# Patient Record
Sex: Male | Born: 1950 | Race: White | Hispanic: No | Marital: Married | Smoking: Never smoker
Health system: Southern US, Community
[De-identification: ages and names within clinical notes are randomized; demographics above are authoritative.]

## PROBLEM LIST (undated history)

## (undated) DIAGNOSIS — G2581 Restless legs syndrome: Secondary | ICD-10-CM

## (undated) DIAGNOSIS — I10 Essential (primary) hypertension: Secondary | ICD-10-CM

## (undated) DIAGNOSIS — R42 Dizziness and giddiness: Secondary | ICD-10-CM

## (undated) DIAGNOSIS — I4891 Unspecified atrial fibrillation: Secondary | ICD-10-CM

## (undated) DIAGNOSIS — E785 Hyperlipidemia, unspecified: Secondary | ICD-10-CM

## (undated) DIAGNOSIS — F32A Depression, unspecified: Secondary | ICD-10-CM

## (undated) DIAGNOSIS — I499 Cardiac arrhythmia, unspecified: Secondary | ICD-10-CM

## (undated) HISTORY — PX: HERNIA REPAIR: SHX51

---

## 2010-08-25 ENCOUNTER — Inpatient Hospital Stay: Payer: Self-pay | Admitting: Internal Medicine

## 2012-04-20 IMAGING — CR DG CHEST 1V PORT
1 series · 1 of 1 positions shown · non-contrast
Comparison: none

REASON FOR EXAM: Chest Pain
COMMENTS:

PROCEDURE:     DXR - DXR PORTABLE CHEST SINGLE VIEW  - August 25, 2010  [DATE]
RESULT:     Comparison: None

[view not recorded]
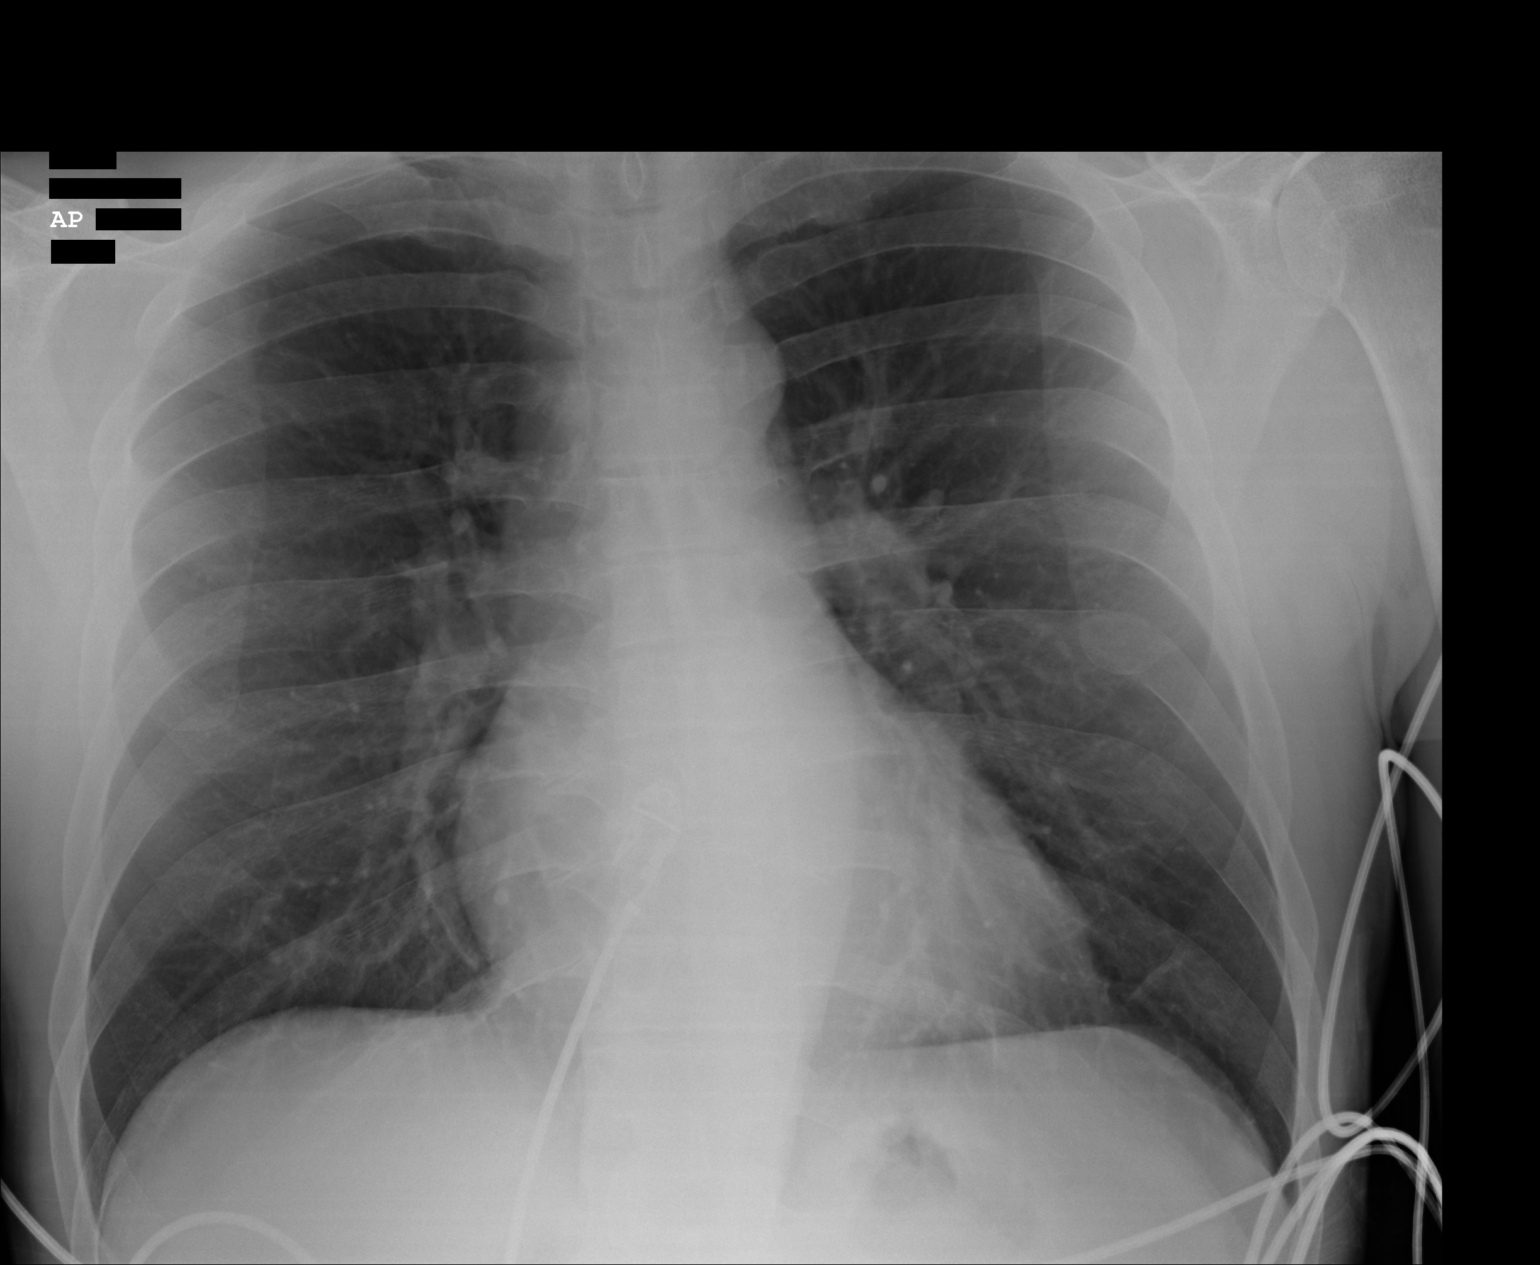

[1 of 1 positions shown; findings below may reference images not displayed]

FINDINGS: Single portable AP chest radiograph is provided.  There is no focal
parenchymal opacity, pleural effusion, or pneumothorax. Normal
cardiomediastinal silhouette. The osseous structures are unremarkable.
IMPRESSION: No acute disease of the chest.

## 2013-08-04 ENCOUNTER — Ambulatory Visit: Payer: Self-pay | Admitting: Surgery

## 2013-08-04 LAB — CBC WITH DIFFERENTIAL/PLATELET
Basophil #: 0.1 10*3/uL (ref 0.0–0.1)
Basophil %: 1.2 %
Eosinophil %: 9.1 %
HCT: 43.5 % (ref 40.0–52.0)
HGB: 14.9 g/dL (ref 13.0–18.0)
Lymphocyte #: 1.9 10*3/uL (ref 1.0–3.6)
MCH: 31.6 pg (ref 26.0–34.0)
MCHC: 34.2 g/dL (ref 32.0–36.0)
Monocyte #: 0.7 x10 3/mm (ref 0.2–1.0)
Neutrophil #: 2.8 10*3/uL (ref 1.4–6.5)
Neutrophil %: 46.3 %
Platelet: 182 10*3/uL (ref 150–440)
WBC: 6 10*3/uL (ref 3.8–10.6)

## 2013-08-04 LAB — BASIC METABOLIC PANEL
Calcium, Total: 8.9 mg/dL (ref 8.5–10.1)
Chloride: 106 mmol/L (ref 98–107)
Creatinine: 1.02 mg/dL (ref 0.60–1.30)
EGFR (African American): >60
EGFR (Non-African Amer.): >60
Glucose: 94 mg/dL (ref 65–99)
Osmolality: 284 (ref 275–301)
Potassium: 4 mmol/L (ref 3.5–5.1)

## 2013-08-06 ENCOUNTER — Ambulatory Visit: Payer: Self-pay | Admitting: Surgery

## 2015-01-07 NOTE — Op Note (Signed)
PATIENT NAME:  Jason Winters, Jason Winters MR#:  [id number] DATE OF BIRTH:  1951/03/17  DATE OF PROCEDURE:  08/06/2013  PREOPERATIVE DIAGNOSIS: Bilateral inguinal hernias.   POSTOPERATIVE DIAGNOSIS: Bilateral inguinal hernias.   PROCEDURE: Laparoscopic bilateral inguinal hernia repairs.   SURGEON: Renda Rolls, M.D.   ANESTHESIA: General.   INDICATIONS: This 64 year old had a right inguinal hernia repair when he was a toddler, but recently has had bilateral bulging in the groins. Bilateral inguinal hernias were demonstrated on physical exam and repair is recommended for definitive treatment.   DESCRIPTION OF PROCEDURE: The patient was placed on the operating table in the supine position under general endotracheal anesthesia. The abdomen was prepared with ChloraPrep and draped in a sterile manner. A short incision was made in the inferior aspect of the umbilicus and carried down to the deep fascia, which was grasped with laryngeal hook and elevated. A Veress needle was inserted, aspirated, and irrigated with a saline solution. Next, the peritoneal cavity was inflated with carbon dioxide. The Veress needle was removed. The 10 mm cannula was inserted. The 10 mm 25 degree laparoscope was inserted to view the peritoneal cavity. There was a direct inguinal hernia seen on the right side and there was a direct and an indirect inguinal hernia seen on the left side. Another incision was made in the right mid abdomen to introduce a 5 mm cannula. Another incision was made in the left mid abdomen to introduce a 5 mm cannula.   On the right side, the incision was made in the peritoneum some 5 inches in length and a peritoneal flap was raised posteriorly. The direct inguinal hernia sac was dissected away from the surrounding fascia away from the attenuated transversalis fascia. Cooper's ligament was identified. Circumferential dissection was carried out around the cord structures and also dissection was carried out around  the inferior epigastric vessels. There was no indirect component. Next, an Atrium mesh was cut to create an oval shape of some 3 x 5 inches with a posterior oblique slit and cruciate incision. This was inserted and passed around the inferior epigastric vessels and around the cord structures. The slit was overlapped and closed with a spiral tacker. The mesh was attached to the Cooper's ligament, the rectus muscle and transversalis fascia with a row of tacks carried laterally to the iliopubic tract. The peritoneum was closed over this with a spiral tacker. Hemostasis was intact.   Attention was then turned to the left side where a similar incision was made in the peritoneum and a peritoneal flap was raised posteriorly. An indirect hernia sac was dissected free from the cord structures and retracted back away from the abdominal wall and also the direct sac was dissected away from the abdominal sidewall, away from the transversalis fascia and identified Cooper's ligament. Next, a similar mesh was cut and inserted, passed around the inferior epigastric vessels and around the cord structures. The slit was overlapped and closed with a spiral tacker. The mesh was likewise attached to the Cooper's ligament, rectus muscle and transversalis fascia. The repair looked good. Peritoneum was closed with a spiral tacker. Hemostasis was intact. The right side was briefly inspected. Hemostasis remained intact. Next, the cannulas were removed allowing carbon dioxide to escape from the peritoneal cavity. The fascial defect at the umbilicus was closed with a 0 Vicryl simple suture. The skin incisions were closed with interrupted 5-0 chromic subcuticular suture, benzoin, and Steri-Strips. Dressings were applied with paper tape. The patient tolerated surgery satisfactorily and was  prepared for transfer to the recovery room.    ____________________________ Shela CommonsJ. Renda RollsWilton Olen Eaves, MD jws:aw D: 08/06/2013 11:51:33 ET T: 08/06/2013 12:00:55  ET JOB#: [id number]  cc: Adella HareJ. Wilton Nusaiba Guallpa, MD, <Dictator> Adella HareWILTON J Derran Sear MD ELECTRONICALLY SIGNED 08/07/2013 19:29

## 2018-12-10 ENCOUNTER — Encounter: Admission: RE | Payer: Self-pay | Source: Home / Self Care

## 2018-12-10 ENCOUNTER — Ambulatory Visit: Admission: RE | Admit: 2018-12-10 | Payer: Medicare HMO | Source: Home / Self Care | Admitting: Internal Medicine

## 2018-12-10 SURGERY — COLONOSCOPY WITH PROPOFOL
Anesthesia: General

## 2020-05-04 LAB — COLOGUARD: COLOGUARD: POSITIVE — AB

## 2020-10-13 ENCOUNTER — Other Ambulatory Visit
Admission: RE | Admit: 2020-10-13 | Discharge: 2020-10-13 | Disposition: A | Discharge status: Home / Self Care | Payer: Medicare HMO | Source: Ambulatory Visit | Attending: Internal Medicine | Admitting: Internal Medicine

## 2020-10-13 ENCOUNTER — Other Ambulatory Visit: Payer: Self-pay

## 2020-10-13 DIAGNOSIS — Z01812 Encounter for preprocedural laboratory examination: Secondary | ICD-10-CM | POA: Diagnosis present

## 2020-10-13 DIAGNOSIS — Z20822 Contact with and (suspected) exposure to covid-19: Secondary | ICD-10-CM | POA: Insufficient documentation

## 2020-10-14 ENCOUNTER — Encounter: Payer: Self-pay | Admitting: Internal Medicine

## 2020-10-14 LAB — SARS CORONAVIRUS 2 (TAT 6-24 HRS): SARS Coronavirus 2: NEGATIVE

## 2020-10-17 ENCOUNTER — Other Ambulatory Visit: Payer: Self-pay

## 2020-10-17 ENCOUNTER — Ambulatory Visit: Payer: Medicare HMO | Admitting: Anesthesiology

## 2020-10-17 ENCOUNTER — Ambulatory Visit
Admission: RE | Admit: 2020-10-17 | Discharge: 2020-10-17 | Disposition: A | Discharge status: Home / Self Care | Payer: Medicare HMO | Attending: Internal Medicine | Admitting: Internal Medicine

## 2020-10-17 ENCOUNTER — Encounter
Admission: RE | Disposition: A | Discharge status: Home / Self Care | Payer: Self-pay | Source: Home / Self Care | Attending: Internal Medicine

## 2020-10-17 ENCOUNTER — Encounter: Payer: Self-pay | Admitting: Internal Medicine

## 2020-10-17 DIAGNOSIS — K621 Rectal polyp: Secondary | ICD-10-CM | POA: Insufficient documentation

## 2020-10-17 DIAGNOSIS — R195 Other fecal abnormalities: Secondary | ICD-10-CM | POA: Diagnosis not present

## 2020-10-17 DIAGNOSIS — Z7901 Long term (current) use of anticoagulants: Secondary | ICD-10-CM | POA: Insufficient documentation

## 2020-10-17 DIAGNOSIS — Z79899 Other long term (current) drug therapy: Secondary | ICD-10-CM | POA: Insufficient documentation

## 2020-10-17 DIAGNOSIS — K640 First degree hemorrhoids: Secondary | ICD-10-CM | POA: Insufficient documentation

## 2020-10-17 HISTORY — DX: Unspecified atrial fibrillation: I48.91

## 2020-10-17 HISTORY — DX: Depression, unspecified: F32.A

## 2020-10-17 HISTORY — DX: Cardiac arrhythmia, unspecified: I49.9

## 2020-10-17 HISTORY — PX: COLONOSCOPY: SHX5424

## 2020-10-17 HISTORY — DX: Hyperlipidemia, unspecified: E78.5

## 2020-10-17 HISTORY — DX: Dizziness and giddiness: R42

## 2020-10-17 HISTORY — DX: Restless legs syndrome: G25.81

## 2020-10-17 HISTORY — DX: Essential (primary) hypertension: I10

## 2020-10-17 SURGERY — COLONOSCOPY
Anesthesia: General

## 2020-10-17 MED ORDER — LIDOCAINE HCL (CARDIAC) PF 100 MG/5ML IV SOSY
PREFILLED_SYRINGE | INTRAVENOUS | Status: DC | PRN
  Administered 2020-10-17: 50 mg via INTRAVENOUS

## 2020-10-17 MED ORDER — PROPOFOL 500 MG/50ML IV EMUL
INTRAVENOUS | Status: AC
  Filled 2020-10-17: qty 50

## 2020-10-17 MED ORDER — SODIUM CHLORIDE 0.9 % IV SOLN: INTRAVENOUS | Status: DC

## 2020-10-17 MED ORDER — PROPOFOL 10 MG/ML IV BOLUS
INTRAVENOUS | Status: DC | PRN
  Administered 2020-10-17: 70 mg via INTRAVENOUS

## 2020-10-17 MED ORDER — PROPOFOL 500 MG/50ML IV EMUL
INTRAVENOUS | Status: DC | PRN
  Administered 2020-10-17: 175 ug/kg/min via INTRAVENOUS

## 2020-10-17 NOTE — Anesthesia Postprocedure Evaluation (Signed)
Anesthesia Post Note  Patient: Jason Winters  Procedure(s) Performed: COLONOSCOPY (N/A )  Patient location during evaluation: Endoscopy Anesthesia Type: General Level of consciousness: awake and alert Pain management: pain level controlled Vital Signs Assessment: post-procedure vital signs reviewed and stable Respiratory status: spontaneous breathing, nonlabored ventilation, respiratory function stable and patient connected to nasal cannula oxygen Cardiovascular status: blood pressure returned to baseline and stable Postop Assessment: no apparent nausea or vomiting Anesthetic complications: no   No complications documented.   Last Vitals:  Vitals:   10/17/20 1518 10/17/20 1519  BP: 98/69 (!) 145/88  Pulse: (!) 46 (!) 56  Resp: 13 13  Temp:    SpO2: 97% 99%    Last Pain:  Vitals:   10/17/20 1519  TempSrc:   PainSc: 0-No pain                 Corinda Gubler

## 2020-10-17 NOTE — Op Note (Signed)
[facility] Gastroenterology Patient Name: Jason Winters Procedure Date: 10/17/2020 2:37 PM MRN: [id number] Account #: [id number] Date of Birth: 1950/10/14 Admit Type: Outpatient Age: 70 Room: [facility] ENDO ROOM 2 Gender: Male Note Status: Finalized Procedure:             Colonoscopy Indications:           Positive Cologuard test Providers:             Boykin Nearing. Norma Fredrickson MD, MD Referring MD:          Hassell Halim MD (Referring MD) Medicines:             Propofol per Anesthesia Complications:         No immediate complications. Procedure:             Pre-Anesthesia Assessment:                        - The risks and benefits of the procedure and the                         sedation options and risks were discussed with the                         patient. All questions were answered and informed                         consent was obtained.                        - Patient identification and proposed procedure were                         verified prior to the procedure by the nurse. The                         procedure was verified in the procedure room.                        - ASA Grade Assessment: III - A patient with severe                         systemic disease.                        - After reviewing the risks and benefits, the patient                         was deemed in satisfactory condition to undergo the                         procedure.                        After obtaining informed consent, the colonoscope was                         passed under direct vision. Throughout the procedure,                         the patient's blood pressure, pulse, and  oxygen                         saturations were monitored continuously. The                         Colonoscope was introduced through the anus and                         advanced to the the cecum, identified by appendiceal                         orifice and ileocecal valve. The colonoscopy was                          performed without difficulty. The patient tolerated                         the procedure well. The quality of the bowel                         preparation was good. The ileocecal valve, appendiceal                         orifice, and rectum were photographed. Findings:      The perianal and digital rectal examinations were normal. Pertinent       negatives include normal sphincter tone and no palpable rectal lesions.      Non-bleeding internal hemorrhoids were found during retroflexion. The       hemorrhoids were Grade I (internal hemorrhoids that do not prolapse).      A 10 mm polyp was found in the rectum. The polyp was semi-pedunculated.       The polyp was removed with a hot snare. Resection and retrieval were       complete. To prevent bleeding after the polypectomy, one hemostatic clip       was successfully placed (MR conditional). There was no bleeding during,       or at the end, of the procedure.      The exam was otherwise without abnormality. Impression:            - Non-bleeding internal hemorrhoids.                        - One 10 mm polyp in the rectum, removed with a hot                         snare. Resected and retrieved. Clip (MR conditional)                         was placed.                        - The examination was otherwise normal. Recommendation:        - Patient has a contact number available for                         emergencies. The signs and symptoms of potential  delayed complications were discussed with the patient.                         Return to normal activities tomorrow. Written                         discharge instructions were provided to the patient.                        - Resume previous diet.                        - Continue present medications.                        - Repeat colonoscopy is recommended for surveillance.                         The colonoscopy date will be determined  after                         pathology results from today's exam become available                         for review.                        - Return to GI office PRN.                        - The findings and recommendations were discussed with                         the patient. Procedure Code(s):     --- Professional ---                        [phone], Colonoscopy, flexible; with removal of                         tumor(s), polyp(s), or other lesion(s) by snare                         technique Diagnosis Code(s):     --- Professional ---                        R19.5, Other fecal abnormalities                        K64.0, First degree hemorrhoids                        K62.1, Rectal polyp CPT copyright 2019 [employer]. All rights reserved. The codes documented in this report are preliminary and upon coder review may  be revised to meet current compliance requirements. Stanton Kidney MD, MD 10/17/2020 3:05:15 PM This report has been signed electronically. Number of Addenda: 0 Note Initiated On: 10/17/2020 2:37 PM Scope Withdrawal Time: 0 hours 11 minutes 15 seconds  Total Procedure Duration: 0 hours 16 minutes 41 seconds  Estimated Blood Loss:  Estimated blood loss: none.      [facility]

## 2020-10-17 NOTE — H&P (Signed)
Outpatient short stay form Pre-procedure 10/17/2020 2:08 PM Jason Winters K. Norma Fredrickson, M.D.  Primary Physician: Kandyce Rud, M.D.  Reason for visit:  Positive Cologuard test  History of present illness:  Patient is a pleasant 70 y/o male presenting on referral from primary care provider for a POSITIVE Cologuard result. Patient denies change in bowel habits, rectal bleeding, weight loss or abdominal pain.      Current Facility-Administered Medications:  .  0.9 %  sodium chloride infusion, , Intravenous, Continuous, [place], Boykin Nearing, MD, Last Rate: 20 mL/hr at 10/17/20 1352, New Bag at 10/17/20 1352  Medications Prior to Admission  Medication Sig Dispense Refill Last Dose  . acetaminophen (TYLENOL) 500 MG tablet Take 500 mg by mouth every 6 (six) hours as needed for moderate pain.     Marland Kitchen amiodarone (PACERONE) 200 MG tablet Take 200 mg by mouth daily.   10/14/2020  . buPROPion (WELLBUTRIN XL) 300 MG 24 hr tablet Take 300 mg by mouth daily.   10/14/2020  . chlorthalidone (HYGROTON) 25 MG tablet Take 25 mg by mouth daily.   10/14/2020  . losartan (COZAAR) 25 MG tablet Take 25 mg by mouth daily.   10/14/2020  . rivaroxaban (XARELTO) 20 MG TABS tablet Take 20 mg by mouth daily with supper.   10/12/2020     No Known Allergies   Past Medical History:  Diagnosis Date  . AF (atrial fibrillation) (HCC)   . Depression   . Dysrhythmia   . Hyperlipidemia   . Hypertension   . RLS (restless legs syndrome)   . Vertigo     Review of systems:  Otherwise negative.    Physical Exam  Gen: Alert, oriented. Appears stated age.  HEENT: [place]/AT. PERRLA. Lungs: CTA, no wheezes. CV: RR nl S1, S2. Abd: soft, benign, no masses. BS+ Ext: No edema. Pulses 2+    Planned procedures: Proceed with colonoscopy. The patient understands the nature of the planned procedure, indications, risks, alternatives and potential complications including but not limited to bleeding, infection, perforation, damage to internal  organs and possible oversedation/side effects from anesthesia. The patient agrees and gives consent to proceed.  Please refer to procedure notes for findings, recommendations and patient disposition/instructions.     Ruperto Kiernan K. Norma Fredrickson, M.D. Gastroenterology 10/17/2020  2:08 PM

## 2020-10-17 NOTE — Anesthesia Procedure Notes (Signed)
Date/Time: 10/17/2020 2:39 PM Performed by: Ginger Carne, CRNA Pre-anesthesia Checklist: Patient identified, Emergency Drugs available, Suction available, Patient being monitored and Timeout performed Patient Re-evaluated:Patient Re-evaluated prior to induction Oxygen Delivery Method: Nasal cannula Preoxygenation: Pre-oxygenation with 100% oxygen Induction Type: IV induction

## 2020-10-17 NOTE — Interval H&P Note (Signed)
History and Physical Interval Note:  10/17/2020 2:09 PM  Jason Winters  has presented today for surgery, with the diagnosis of positive colrectal screening.  The various methods of treatment have been discussed with the patient and family. After consideration of risks, benefits and other options for treatment, the patient has consented to  Procedure(s): COLONOSCOPY (N/A) as a surgical intervention.  The patient's history has been reviewed, patient examined, no change in status, stable for surgery.  I have reviewed the patient's chart and labs.  Questions were answered to the patient's satisfaction.     [place], [place]

## 2020-10-17 NOTE — Transfer of Care (Signed)
Immediate Anesthesia Transfer of Care Note  Patient: Jason Winters  Procedure(s) Performed: COLONOSCOPY (N/A )  Patient Location: PACU  Anesthesia Type:General  Level of Consciousness: sedated  Airway & Oxygen Therapy: Patient Spontanous Breathing  Post-op Assessment: Report given to RN and Post -op Vital signs reviewed and stable  Post vital signs: Reviewed and stable  Last Vitals:  Vitals Value Taken Time  BP    Temp    Pulse 52 10/17/20 1509  Resp 13 10/17/20 1509  SpO2 99 % 10/17/20 1509  Vitals shown include unvalidated device data.  Last Pain:  Vitals:   10/17/20 1330  TempSrc: Temporal  PainSc: 0-No pain         Complications: No complications documented.

## 2020-10-17 NOTE — Anesthesia Preprocedure Evaluation (Signed)
Anesthesia Evaluation  Patient identified by MRN, date of birth, ID band Patient awake    Reviewed: Allergy & Precautions, NPO status , Patient's Chart, lab work & pertinent test results  History of Anesthesia Complications Negative for: history of anesthetic complications  Airway Mallampati: II       Dental   Pulmonary neg sleep apnea, neg COPD, Not current smoker,           Cardiovascular hypertension, Pt. on medications (-) Past MI and (-) CHF + dysrhythmias Atrial Fibrillation (-) Valvular Problems/Murmurs     Neuro/Psych neg Seizures Depression    GI/Hepatic Neg liver ROS, neg GERD  ,  Endo/Other  neg diabetes  Renal/GU Renal InsufficiencyRenal disease     Musculoskeletal   Abdominal   Peds  Hematology   Anesthesia Other Findings   Reproductive/Obstetrics                             Anesthesia Physical Anesthesia Plan  ASA: III  Anesthesia Plan: General   Post-op Pain Management:    Induction: Intravenous  PONV Risk Score and Plan: 2 and Propofol infusion and TIVA  Airway Management Planned: Nasal Cannula  Additional Equipment:   Intra-op Plan:   Post-operative Plan:   Informed Consent: I have reviewed the patients History and Physical, chart, labs and discussed the procedure including the risks, benefits and alternatives for the proposed anesthesia with the patient or authorized representative who has indicated his/her understanding and acceptance.       Plan Discussed with:   Anesthesia Plan Comments:         Anesthesia Quick Evaluation

## 2020-10-18 ENCOUNTER — Encounter: Payer: Self-pay | Admitting: Internal Medicine

## 2020-10-19 LAB — SURGICAL PATHOLOGY

## 2020-11-17 DIAGNOSIS — I4891 Unspecified atrial fibrillation: Secondary | ICD-10-CM | POA: Diagnosis not present

## 2020-11-17 DIAGNOSIS — R0602 Shortness of breath: Secondary | ICD-10-CM | POA: Diagnosis not present

## 2020-11-17 DIAGNOSIS — I2510 Atherosclerotic heart disease of native coronary artery without angina pectoris: Secondary | ICD-10-CM | POA: Diagnosis not present

## 2020-11-17 DIAGNOSIS — I10 Essential (primary) hypertension: Secondary | ICD-10-CM | POA: Diagnosis not present

## 2020-11-17 DIAGNOSIS — I340 Nonrheumatic mitral (valve) insufficiency: Secondary | ICD-10-CM | POA: Diagnosis not present

## 2021-03-21 DIAGNOSIS — I340 Nonrheumatic mitral (valve) insufficiency: Secondary | ICD-10-CM | POA: Diagnosis not present

## 2021-03-21 DIAGNOSIS — I10 Essential (primary) hypertension: Secondary | ICD-10-CM | POA: Diagnosis not present

## 2021-03-21 DIAGNOSIS — I2510 Atherosclerotic heart disease of native coronary artery without angina pectoris: Secondary | ICD-10-CM | POA: Diagnosis not present

## 2021-03-21 DIAGNOSIS — I4891 Unspecified atrial fibrillation: Secondary | ICD-10-CM | POA: Diagnosis not present

## 2021-04-25 DIAGNOSIS — Z1331 Encounter for screening for depression: Secondary | ICD-10-CM | POA: Diagnosis not present

## 2021-04-25 DIAGNOSIS — Z125 Encounter for screening for malignant neoplasm of prostate: Secondary | ICD-10-CM | POA: Diagnosis not present

## 2021-04-25 DIAGNOSIS — Z0000 Encounter for general adult medical examination without abnormal findings: Secondary | ICD-10-CM | POA: Diagnosis not present

## 2021-04-25 DIAGNOSIS — E7800 Pure hypercholesterolemia, unspecified: Secondary | ICD-10-CM | POA: Diagnosis not present

## 2021-04-25 DIAGNOSIS — Z79899 Other long term (current) drug therapy: Secondary | ICD-10-CM | POA: Diagnosis not present

## 2021-05-23 DIAGNOSIS — I4891 Unspecified atrial fibrillation: Secondary | ICD-10-CM | POA: Diagnosis not present

## 2021-05-23 DIAGNOSIS — I2510 Atherosclerotic heart disease of native coronary artery without angina pectoris: Secondary | ICD-10-CM | POA: Diagnosis not present

## 2021-05-23 DIAGNOSIS — I10 Essential (primary) hypertension: Secondary | ICD-10-CM | POA: Diagnosis not present

## 2021-05-23 DIAGNOSIS — I340 Nonrheumatic mitral (valve) insufficiency: Secondary | ICD-10-CM | POA: Diagnosis not present

## 2021-05-31 DIAGNOSIS — M7051 Other bursitis of knee, right knee: Secondary | ICD-10-CM | POA: Diagnosis not present

## 2022-12-17 ENCOUNTER — Other Ambulatory Visit: Payer: Self-pay | Admitting: Cardiovascular Disease

## 2023-01-09 ENCOUNTER — Ambulatory Visit: Payer: Medicare HMO | Admitting: Dermatology

## 2023-01-17 ENCOUNTER — Other Ambulatory Visit: Payer: Self-pay | Admitting: Cardiovascular Disease

## 2023-01-25 ENCOUNTER — Other Ambulatory Visit: Payer: Self-pay | Admitting: Cardiovascular Disease

## 2023-03-04 ENCOUNTER — Ambulatory Visit: Payer: Medicare HMO | Admitting: Cardiovascular Disease

## 2023-03-11 ENCOUNTER — Encounter: Payer: Self-pay | Admitting: Cardiovascular Disease

## 2023-03-11 ENCOUNTER — Ambulatory Visit: Payer: Medicare HMO | Admitting: Cardiovascular Disease

## 2023-03-11 VITALS — BP 120/90 | HR 53 | Ht 71.0 in | Wt 176.2 lb

## 2023-03-11 DIAGNOSIS — E782 Mixed hyperlipidemia: Secondary | ICD-10-CM

## 2023-03-11 DIAGNOSIS — Z8679 Personal history of other diseases of the circulatory system: Secondary | ICD-10-CM

## 2023-03-11 DIAGNOSIS — G4733 Obstructive sleep apnea (adult) (pediatric): Secondary | ICD-10-CM | POA: Diagnosis not present

## 2023-03-11 DIAGNOSIS — I340 Nonrheumatic mitral (valve) insufficiency: Secondary | ICD-10-CM

## 2023-03-11 DIAGNOSIS — I10 Essential (primary) hypertension: Secondary | ICD-10-CM

## 2023-03-11 NOTE — Progress Notes (Signed)
Cardiology Office Note   Date:  03/11/2023   ID:  Jason Winters, Jason Winters [birth year], MRN [id number]  PCP:  Kandyce Rud, MD  Cardiologist:  Adrian Blackwater, MD      History of Present Illness: Jason Winters is a 72 y.o. male who presents for  Chief Complaint  Patient presents with   Follow-up    5 mo F/u    HPI    Past Medical History:  Diagnosis Date   AF (atrial fibrillation) (HCC)    Depression    Dysrhythmia    Hyperlipidemia    Hypertension    RLS (restless legs syndrome)    Vertigo      Past Surgical History:  Procedure Laterality Date   COLONOSCOPY N/A 10/17/2020   Procedure: COLONOSCOPY;  Surgeon: Toledo, Boykin Nearing, MD;  Location: [facility] ENDOSCOPY;  Service: Gastroenterology;  Laterality: N/A;   HERNIA REPAIR       ACTIVE PROBLEMS & CONDITIONS    Assessment of Shortness of Breath    Atrial Fibrillation - History of paroxysmal AF, taking amiodarone and xarelto. Now [place] VASC score of 2 when he turned 67,    Atypical Chest Pain    Bronchitis    Chest Pain Or Discomfort    Coronary Artery Disease    Difficulty Breathing (Dyspnea)    Family History of Hypertension Systemic    History of Atrial Fibrillation - paroxysmal    History of Coronary Artery Disease - father    History of Essential Hypertension - borderline    History of Mitral Regurgitation    Hyperlipidemia    Hypertension Systemic    Mitral Regurgitation    Reported Family History of Heart Disease - father    Restless legs syndrome     CHIEF COMPLAINT  The Chief Complaint is: 3 MO FU.     HISTORY OF PRESENT ILLNESS  Jason Winters is a 72 year old male.   Patient in office for routine cardiac exam. No cardiac complaints.  Allergy list reviewed   Problem list reviewed   Medication list reviewed    No chest pain or discomfort   No palpitations  , and The heart rate was not fast    Not feeling congested in the chest   No dyspnea   No cough   No wheezing     CURRENT  MEDICATION    Amiodarone HCl 100 MG Oral Tablet TAKE 1 TABLET BY MOUTH EVERY DAY, 90 days, 1 refills    buPROPion HCl ER (SR) 150 MG Oral Tablet Extended Release 12 Hour  once daily, 0 days, 0 refills    hydroCHLOROthiazide 12.5 MG Oral Tablet TAKE 1 TABLET BY MOUTH EVERY DAY, 90 days, 1 refills    Losartan Potassium 25 MG Oral Tablet TAKE 1 TABLET BY MOUTH EVERY DAY, 90 days, 1 refills    Rosuvastatin Calcium 20 MG Oral Tablet TAKE 1 TABLET BY MOUTH EVERY DAY, 90 days, 1 refills    Xarelto 20 MG Oral Tablet TAKE 1 TABLET BY MOUTH EVERY DAY, 90 days, 1 refills     PAST MEDICAL/SURGICAL HISTORY  Diagnoses:  Atrial fibrillation paroxysmal Coronary artery disease father Mitral regurgitation MODERATE Essential hypertension borderline.   Nonorganic sleep apnea.   Hyperlipidemia Surgeries:  Deviated septum repair  Bilateral  inguinal hernia repair  Restless leg syndrome , but no sleep apnoea.     SOCIAL HISTORY  Tobacco use:  Not a current smoker and smoking status: Never smoker. Alcohol: Alcohol use.  Habits: Poor exercise habits. 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     ALLERGIES    No Known Allergies     FAMILY HISTORY  Heart disease father Lung cancer- Paternal grandmother  Colon cancer-Paernal grandfather  Systemic hypertension     REVIEW OF SYSTEMS  Systemic: Not feeling poorly (malaise).  No recent weight change. Head: No headache and no sinus pain. Cardiovascular: No chest pain or discomfort, no palpitations, and the heart rate was not fast. Pulmonary: No dyspnea, no cough, and no wheezing. Gastrointestinal: No heartburn.  No nausea, no vomiting, no abdominal pain, and no diarrhea. Neurological: No dizziness, no vertigo, and no fainting. Psychological: No anxiety and no depression.     PHYSICAL FINDINGS    Vitals taken 10/16/2022 10:31 am  BP-Sitting  120/80 mmHg 100 - 120/60 - 80  Pulse Rate-Sitting  51 bpm 50 - 100  Temp-Oral  97.4 F 96 -  101  Height  71 in 64 - 74  Weight  170 lbs 9.6 oz 123 - 215  Body Mass Index  23.8 kg/m2   Body Surface Area  2 m2   Oxygen Saturation  97 % 93 - 100    General Appearance:  In no acute distress. Lungs:  Clear to auscultation. Cardiovascular: Jugular Venous Distention:  JVD not increased.   JVD not increased. Heart Rate And Rhythm:  Normal.   Normal. Heart Sounds:  Normal. Murmurs:  No murmurs were heard. Carotid Arteries:  No bruit in the carotid artery. Arterial Pulses:  Equal bilaterally and normal. Edema:  Not present. Abdomen: Auscultation:  Bowel sounds were normal. Palpation:  Abdominal non-tender. Neurological:  Oriented to time, place, and person.     ASSESSMENT   Atrial fibrillation  Coronary artery disease  Mitral regurgitation  Systemic hypertension  Nonorganic sleep apnea  Hyperlipidemia     THERAPY   Continue current medication.  Clinical summary provided to patient.     COUNSELING/EDUCATION   Education and counseling diet and exercise     PLAN     Unspecified atrial fibrillation Xarelto 20 MG tablet TAKE 1 TABLET BY MOUTH EVERY DAY, 90 days, 1 refills     Return to the clinic if condition worsens or new symptoms arise  Follow-up visit Patient doing well. No complaints. In sinus rhythm on auscultation. Continue same medications.        Started cpap machine.     Return in 4-5 months or sooner if needed.      Adrian Blackwater MD  Electronically signed by: Adrian Blackwater     Date: 10/16/2022 10:45 Current Outpatient Medications  Medication Sig Dispense Refill   acetaminophen (TYLENOL) 500 MG tablet Take 500 mg by mouth every 6 (six) hours as needed for moderate pain.     amiodarone (PACERONE) 100 MG tablet TAKE 1 TABLET BY MOUTH EVERY DAY 90 tablet 1   buPROPion (WELLBUTRIN XL) 300 MG 24 hr tablet Take 300 mg by mouth daily.     chlorthalidone (HYGROTON) 25 MG tablet Take 25 mg by mouth daily.     losartan (COZAAR) 25 MG tablet  TAKE 1 TABLET BY MOUTH EVERY DAY 90 tablet 1   rivaroxaban (XARELTO) 20 MG TABS tablet Take 20 mg by mouth daily with supper.     rosuvastatin (CRESTOR) 20 MG tablet TAKE 1 TABLET BY MOUTH EVERY DAY 90 tablet 1   No current facility-administered medications for this visit.    Allergies:   Patient has no known allergies.  Social History:   reports that he has never smoked. He has never used smokeless tobacco. He reports that he does not drink alcohol and does not use drugs.   Family History:  family history is not on file.    ROS:     Review of Systems  Constitutional: Negative.   HENT: Negative.    Eyes: Negative.   Respiratory: Negative.    Gastrointestinal: Negative.   Genitourinary: Negative.   Musculoskeletal: Negative.   Skin: Negative.   Neurological: Negative.   Endo/Heme/Allergies: Negative.   Psychiatric/Behavioral: Negative.    All other systems reviewed and are negative.     All other systems are reviewed and negative.    PHYSICAL EXAM: VS:  BP (!) 120/90   Pulse (!) 53   Ht 5\' 11"  (1.803 m)   Wt 176 lb 3.2 oz (79.9 kg)   SpO2 98%   BMI 24.57 kg/m  , BMI Body mass index is 24.57 kg/m. Last weight:  Wt Readings from Last 3 Encounters:  03/11/23 176 lb 3.2 oz (79.9 kg)  10/17/20 160 lb (72.6 kg)     Physical Exam Vitals reviewed.  Constitutional:      Appearance: Normal appearance. He is normal weight.  HENT:     Head: Normocephalic.     Nose: Nose normal.     Mouth/Throat:     Mouth: Mucous membranes are moist.  Eyes:     Pupils: Pupils are equal, round, and reactive to light.  Cardiovascular:     Rate and Rhythm: Normal rate and regular rhythm.     Pulses: Normal pulses.     Heart sounds: Normal heart sounds.  Pulmonary:     Effort: Pulmonary effort is normal.  Abdominal:     General: Abdomen is flat. Bowel sounds are normal.  Musculoskeletal:        General: Normal range of motion.     Cervical back: Normal range of motion.   Skin:    General: Skin is warm.  Neurological:     General: No focal deficit present.     Mental Status: He is alert.  Psychiatric:        Mood and Affect: Mood normal.       EKG:   Recent Labs: No results found for requested labs within last 365 days.    Lipid Panel No results found for: "CHOL", "TRIG", "HDL", "CHOLHDL", "VLDL", "[facility]", "LDLDIRECT"    TESTS                                                                                          [facility] [address] [place], [place] [address] [phone] STUDY:  Gated Stress / Rest Myocardial Perfusion Imaging Tomographic (SPECT) Including attenuation correction Wall Motion, Left Ventricular Ejection Fraction By Gated Technique.Treadmill Stress Test. SEX: Male   WEIGHT: 160 lbs  HEIGHT: 71 in    ARMS UP: YES/NO  REFERRING PHYSICIAN: Dr.Dequarius Jeffries Welton Flakes                                                                                                                                                                                                                       INDICATION FOR STUDY:  CP                                                                                                                                                                                                                   TECHNIQUE:  Approximately 20 minutes following the intravenous administration of 10.6 mCi of Tc-100m Sestamibi after stress testing in a reclined supine position with arms above their head if able to do so, gated SPECT imaging of the heart was performed. After about a 2hr break, the patient was injected intravenously with 32.1 mCi of Tc-5m Sestamibi.  Approximately 45 minutes later in the same position as stress imaging SPECT rest imaging of the heart was performed.  STRESS BY:  Adrian Blackwater, MD PROTOCOL: Smitty Cords  MAX PRED HR: 151                     85%: 128               75%: 113                                                                                                                   RESTING BP:  130/78  RESTING HR: 57  PEAK BP: 132/80   PEAK HR: 100 (66%)                                                                    EXERCISE DURATION: 8:00                                             METS: 10.1     REASON FOR TEST TERMINATION:  Fatigue                                                                                                                                 SYMPTOMS: Fatigue  [facility] TREADMILL SCORE: 8                                       RISK:  Low  EKG RESULTS:  Sinus bradycardia. 57/min. No significant ST changes at peak exercise.                                                            IMAGE QUALITY: Good                                                                                                                                                                                                                                                                                                                                  PERFUSION/WALL MOTION FINDINGS: EF = 74%. No perfusion defects, normal wall motion.                                                                           IMPRESSION:  Normal stress test with normal LVEF. Low heart rate.  Adrian Blackwater, MD Stress  Interpreting Physician / Nuclear Interpreting Physician                         Adrian Blackwater MD  Electronically signed by: Adrian Blackwater     Date: 03/13/2022 13:57 REASON FOR VISIT  Visit for: Echocardiogram/I 48.91  Sex:   Male         wt=162    lbs.  BP=118/66  Height=71    inches.        TESTS  Imaging: Echocardiogram:  An echocardiogram in (2-d) mode was performed and in Doppler mode with color flow velocity mapping was performed. The aortic valve cusps are abnormal 2.5 cm, flow velocity 1.19  m/s, and systolic calculated mean flow gradient 3 mmHg. Mitral valve diastolic peak flow velocity E .51   m/s and E/A ratio 1.0. Aortic root diameter 4.0   cm. The LVOT internal diameter 3.6  cm and flow velocity was abnormal .887   m/s. LV systolic dimension 2.09 cm, diastolic 2.98  cm, posterior wall thickness 1.29  cm, fractional shortening 30.5 %, and EF 59.6 %. IVS thickness 1.68   cm. LA dimension 3.7  cm. Mitral Valve has Mild Regurgitation. Pulmonic Valve has Trace Regurgitation. Tricuspid Valve hasTrace to Mild Regurgitation.     ASSESSMENT  Technically adequate study.  Normal chamber sizes.  Normal left ventricular systolic function.  Mild left ventricular hypertrophy with GRADE 1 (relaxation abnormality) diastolic dysfunction.  Normal right ventricular systolic function.  Normal right ventricular diastolic function.  Normal left ventricular wall motion.  Normal right ventricular wall motion.  Trace pulmonary regurgitation.  Trace to Mild tricuspid regurgitation.  Mild pulmonary hypertension.  Mild mitral regurgitation.  No pericardial effusion.  Mildly dilated Left atrium  Mild LVH.     THERAPY   Referring physician: Laurier Nancy  Sonographer: Adrian Blackwater.      Adrian Blackwater MD  Electronically signed by: Adrian Blackwater     Date: 02/14/2022 11:17 Other studies Reviewed: Additional studies/ records that were reviewed today include:  Review of the  above records demonstrates:       No data to display            ASSESSMENT AND PLAN:    ICD-10-CM   1. Nonrheumatic mitral valve regurgitation  I34.0     2. Atrial fibrillation, currently in sinus rhythm  Z86.79    Doing well on amiodrone and xarelto, no issues.    3. Mixed hyperlipidemia  E78.2     4. Obstructive sleep apnea syndrome  G47.33     5. Primary hypertension  I10        Problem List Items Addressed This Visit   None Visit Diagnoses     Nonrheumatic mitral valve regurgitation    -  Primary   Atrial fibrillation, currently in sinus rhythm       Doing well on amiodrone and xarelto, no issues.   Mixed hyperlipidemia       Obstructive sleep apnea syndrome       Primary hypertension              Disposition:   Return in about 3 months (around 06/11/2023).    Total time spent: 30 minutes  Signed,  Adrian Blackwater, MD  03/11/2023 9:49 AM    [facility]

## 2023-03-15 ENCOUNTER — Other Ambulatory Visit: Payer: Self-pay | Admitting: Cardiovascular Disease

## 2023-05-27 ENCOUNTER — Other Ambulatory Visit: Payer: Self-pay | Admitting: Cardiovascular Disease

## 2023-05-27 DIAGNOSIS — I4891 Unspecified atrial fibrillation: Secondary | ICD-10-CM

## 2023-06-11 ENCOUNTER — Ambulatory Visit: Payer: Medicare HMO | Admitting: Cardiovascular Disease

## 2023-06-11 ENCOUNTER — Encounter: Payer: Self-pay | Admitting: Cardiovascular Disease

## 2023-06-11 VITALS — BP 130/82 | HR 57 | Ht 71.0 in | Wt 177.2 lb

## 2023-06-11 DIAGNOSIS — I340 Nonrheumatic mitral (valve) insufficiency: Secondary | ICD-10-CM

## 2023-06-11 DIAGNOSIS — E782 Mixed hyperlipidemia: Secondary | ICD-10-CM

## 2023-06-11 DIAGNOSIS — G4733 Obstructive sleep apnea (adult) (pediatric): Secondary | ICD-10-CM

## 2023-06-11 DIAGNOSIS — Z8679 Personal history of other diseases of the circulatory system: Secondary | ICD-10-CM | POA: Diagnosis not present

## 2023-06-11 DIAGNOSIS — I10 Essential (primary) hypertension: Secondary | ICD-10-CM | POA: Diagnosis not present

## 2023-06-11 NOTE — Progress Notes (Signed)
Cardiology Office Note   Date:  06/11/2023   ID:  Sajjad, Billett [birth year], MRN [id number]  PCP:  Kandyce Rud, MD  Cardiologist:  Adrian Blackwater, MD      History of Present Illness: Jason Winters is a 72 y.o. male who presents for  Chief Complaint  Patient presents with   Follow-up    Doing well      Past Medical History:  Diagnosis Date   AF (atrial fibrillation) (HCC)    Depression    Dysrhythmia    Hyperlipidemia    Hypertension    RLS (restless legs syndrome)    Vertigo      Past Surgical History:  Procedure Laterality Date   COLONOSCOPY N/A 10/17/2020   Procedure: COLONOSCOPY;  Surgeon: Toledo, Boykin Nearing, MD;  Location: [facility] ENDOSCOPY;  Service: Gastroenterology;  Laterality: N/A;   HERNIA REPAIR       Current Outpatient Medications  Medication Sig Dispense Refill   acetaminophen (TYLENOL) 500 MG tablet Take 500 mg by mouth every 6 (six) hours as needed for moderate pain.     amiodarone (PACERONE) 100 MG tablet TAKE 1 TABLET BY MOUTH EVERY DAY 90 tablet 1   buPROPion (WELLBUTRIN XL) 300 MG 24 hr tablet Take 300 mg by mouth daily.     hydrochlorothiazide (HYDRODIURIL) 12.5 MG tablet TAKE 1 TABLET BY MOUTH EVERY DAY 90 tablet 1   losartan (COZAAR) 25 MG tablet TAKE 1 TABLET BY MOUTH EVERY DAY 90 tablet 1   rosuvastatin (CRESTOR) 20 MG tablet TAKE 1 TABLET BY MOUTH EVERY DAY 90 tablet 1   XARELTO 20 MG TABS tablet TAKE 1 TABLET BY MOUTH EVERY DAY 90 tablet 1   No current facility-administered medications for this visit.    Allergies:   Patient has no known allergies.    Social History:   reports that he has never smoked. He has never used smokeless tobacco. He reports that he does not drink alcohol and does not use drugs.   Family History:  family history is not on file.    ROS:     Review of Systems  Constitutional: Negative.   HENT: Negative.    Eyes: Negative.   Respiratory: Negative.    Gastrointestinal: Negative.    Genitourinary: Negative.   Musculoskeletal: Negative.   Skin: Negative.   Neurological: Negative.   Endo/Heme/Allergies: Negative.   Psychiatric/Behavioral: Negative.    All other systems reviewed and are negative.     All other systems are reviewed and negative.    PHYSICAL EXAM: VS:  BP 130/82 (BP Location: Left Arm)   Pulse (!) 57   Ht 5\' 11"  (1.803 m)   Wt 177 lb 3.2 oz (80.4 kg)   SpO2 96%   BMI 24.71 kg/m  , BMI Body mass index is 24.71 kg/m. Last weight:  Wt Readings from Last 3 Encounters:  06/11/23 177 lb 3.2 oz (80.4 kg)  03/11/23 176 lb 3.2 oz (79.9 kg)  10/17/20 160 lb (72.6 kg)     Physical Exam Vitals reviewed.  Constitutional:      Appearance: Normal appearance. He is normal weight.  HENT:     Head: Normocephalic.     Nose: Nose normal.     Mouth/Throat:     Mouth: Mucous membranes are moist.  Eyes:     Pupils: Pupils are equal, round, and reactive to light.  Cardiovascular:     Rate and Rhythm: Normal rate and regular rhythm.  Pulses: Normal pulses.     Heart sounds: Normal heart sounds.  Pulmonary:     Effort: Pulmonary effort is normal.  Abdominal:     General: Abdomen is flat. Bowel sounds are normal.  Musculoskeletal:        General: Normal range of motion.     Cervical back: Normal range of motion.  Skin:    General: Skin is warm.  Neurological:     General: No focal deficit present.     Mental Status: He is alert.  Psychiatric:        Mood and Affect: Mood normal.       EKG:   Recent Labs: No results found for requested labs within last 365 days.    Lipid Panel No results found for: "CHOL", "TRIG", "HDL", "CHOLHDL", "VLDL", "LDLCALC", "LDLDIRECT"    Other studies Reviewed: Additional studies/ records that were reviewed today include:  Review of the above records demonstrates:       No data to display            ASSESSMENT AND PLAN:    ICD-10-CM   1. Nonrheumatic mitral valve regurgitation  I34.0      2. Atrial fibrillation, currently in sinus rhythm  Z86.79    doing well    3. Mixed hyperlipidemia  E78.2     4. Primary hypertension  I10     5. Obstructive sleep apnea syndrome  G47.33        Problem List Items Addressed This Visit   None Visit Diagnoses     Nonrheumatic mitral valve regurgitation    -  Primary   Atrial fibrillation, currently in sinus rhythm       doing well   Mixed hyperlipidemia       Primary hypertension       Obstructive sleep apnea syndrome              Disposition:   Return in about 3 months (around 09/10/2023).    Total time spent: 30 minutes  Signed,  Adrian Blackwater, MD  06/11/2023 9:24 AM    [facility]

## 2023-06-23 ENCOUNTER — Other Ambulatory Visit: Payer: Self-pay | Admitting: Cardiovascular Disease

## 2023-07-12 ENCOUNTER — Other Ambulatory Visit: Payer: Self-pay | Admitting: Cardiovascular Disease

## 2023-08-26 ENCOUNTER — Encounter: Payer: Self-pay | Admitting: Cardiovascular Disease

## 2023-08-26 ENCOUNTER — Ambulatory Visit: Payer: Medicare HMO | Admitting: Cardiovascular Disease

## 2023-08-26 VITALS — BP 142/89 | HR 80 | Ht 71.0 in | Wt 175.6 lb

## 2023-08-26 DIAGNOSIS — I10 Essential (primary) hypertension: Secondary | ICD-10-CM

## 2023-08-26 DIAGNOSIS — E782 Mixed hyperlipidemia: Secondary | ICD-10-CM

## 2023-08-26 DIAGNOSIS — Z8679 Personal history of other diseases of the circulatory system: Secondary | ICD-10-CM | POA: Diagnosis not present

## 2023-08-26 DIAGNOSIS — I340 Nonrheumatic mitral (valve) insufficiency: Secondary | ICD-10-CM

## 2023-08-26 DIAGNOSIS — G4733 Obstructive sleep apnea (adult) (pediatric): Secondary | ICD-10-CM

## 2023-08-26 DIAGNOSIS — R002 Palpitations: Secondary | ICD-10-CM

## 2023-08-26 MED ORDER — AMIODARONE HCL 200 MG PO TABS: 200.0000 mg | ORAL_TABLET | Freq: Every day | ORAL | 11 refills | Status: DC

## 2023-08-26 NOTE — Progress Notes (Signed)
Cardiology Office Note   Date:  08/26/2023   ID:  Jason Winters, Jason Winters 05/04/1951, MRN [id number]  PCP:  Kandyce Rud, MD  Cardiologist:  Adrian Blackwater, MD      History of Present Illness: Jason Winters is a 72 y.o. male who presents for No chief complaint on file.   Palpitations  This is a new problem. The current episode started in the past 7 days. The problem has been waxing and waning. Pertinent negatives include no chest fullness, chest pain, shortness of breath or syncope.      Past Medical History:  Diagnosis Date   AF (atrial fibrillation) (HCC)    Depression    Dysrhythmia    Hyperlipidemia    Hypertension    RLS (restless legs syndrome)    Vertigo      Past Surgical History:  Procedure Laterality Date   COLONOSCOPY N/A 10/17/2020   Procedure: COLONOSCOPY;  Surgeon: Toledo, Boykin Nearing, MD;  Location: [facility] ENDOSCOPY;  Service: Gastroenterology;  Laterality: N/A;   HERNIA REPAIR       Current Outpatient Medications  Medication Sig Dispense Refill   amiodarone (PACERONE) 200 MG tablet Take 1 tablet (200 mg total) by mouth daily. 30 tablet 11   acetaminophen (TYLENOL) 500 MG tablet Take 500 mg by mouth every 6 (six) hours as needed for moderate pain.     buPROPion (WELLBUTRIN XL) 300 MG 24 hr tablet Take 300 mg by mouth daily.     hydrochlorothiazide (HYDRODIURIL) 12.5 MG tablet TAKE 1 TABLET BY MOUTH EVERY DAY 90 tablet 1   losartan (COZAAR) 25 MG tablet TAKE 1 TABLET BY MOUTH EVERY DAY 90 tablet 1   rosuvastatin (CRESTOR) 20 MG tablet TAKE 1 TABLET BY MOUTH EVERY DAY 90 tablet 1   XARELTO 20 MG TABS tablet TAKE 1 TABLET BY MOUTH EVERY DAY 90 tablet 1   No current facility-administered medications for this visit.    Allergies:   Patient has no known allergies.    Social History:   reports that he has never smoked. He has never used smokeless tobacco. He reports that he does not drink alcohol and does not use drugs.   Family History:  family  history is not on file.    ROS:     Review of Systems  Constitutional: Negative.   HENT: Negative.    Eyes: Negative.   Respiratory: Negative.  Negative for shortness of breath.   Cardiovascular:  Positive for palpitations. Negative for chest pain and syncope.  Gastrointestinal: Negative.   Genitourinary: Negative.   Musculoskeletal: Negative.   Skin: Negative.   Neurological: Negative.   Endo/Heme/Allergies: Negative.   Psychiatric/Behavioral: Negative.    All other systems reviewed and are negative.     All other systems are reviewed and negative.    PHYSICAL EXAM: VS:  BP (!) 142/89   Pulse 80   Ht 5\' 11"  (1.803 m)   Wt 175 lb 9.6 oz (79.7 kg)   SpO2 (!) 77%   BMI 24.49 kg/m  , BMI Body mass index is 24.49 kg/m. Last weight:  Wt Readings from Last 3 Encounters:  08/26/23 175 lb 9.6 oz (79.7 kg)  06/11/23 177 lb 3.2 oz (80.4 kg)  03/11/23 176 lb 3.2 oz (79.9 kg)     Physical Exam Vitals reviewed.  Constitutional:      Appearance: Normal appearance. He is normal weight.  HENT:     Head: Normocephalic.     Nose: Nose normal.  Mouth/Throat:     Mouth: Mucous membranes are moist.  Eyes:     Pupils: Pupils are equal, round, and reactive to light.  Cardiovascular:     Rate and Rhythm: Normal rate and regular rhythm.     Pulses: Normal pulses.     Heart sounds: Normal heart sounds.  Pulmonary:     Effort: Pulmonary effort is normal.  Abdominal:     General: Abdomen is flat. Bowel sounds are normal.  Musculoskeletal:        General: Normal range of motion.     Cervical back: Normal range of motion.  Skin:    General: Skin is warm.  Neurological:     General: No focal deficit present.     Mental Status: He is alert.  Psychiatric:        Mood and Affect: Mood normal.       EKG:   Recent Labs: No results found for requested labs within last 365 days.    Lipid Panel No results found for: "CHOL", "TRIG", "HDL", "CHOLHDL", "VLDL", "LDLCALC",  "LDLDIRECT"    Other studies Reviewed: Additional studies/ records that were reviewed today include:  Review of the above records demonstrates:       No data to display            ASSESSMENT AND PLAN:    ICD-10-CM   1. Nonrheumatic mitral valve regurgitation  I34.0 amiodarone (PACERONE) 200 MG tablet    PCV ECHOCARDIOGRAM COMPLETE    2. Atrial fibrillation, currently in sinus rhythm  Z86.79 amiodarone (PACERONE) 200 MG tablet    PCV ECHOCARDIOGRAM COMPLETE   still in nsr to change amiodrone 200 daily    3. Mixed hyperlipidemia  E78.2 amiodarone (PACERONE) 200 MG tablet    PCV ECHOCARDIOGRAM COMPLETE    4. Primary hypertension  I10 amiodarone (PACERONE) 200 MG tablet    PCV ECHOCARDIOGRAM COMPLETE    5. Obstructive sleep apnea syndrome  G47.33 amiodarone (PACERONE) 200 MG tablet    PCV ECHOCARDIOGRAM COMPLETE    6. Palpitation  R00.2 amiodarone (PACERONE) 200 MG tablet    PCV ECHOCARDIOGRAM COMPLETE   was in afib earlier on monitor, and now on ekg nsr 72/min       Problem List Items Addressed This Visit   None Visit Diagnoses     Nonrheumatic mitral valve regurgitation    -  Primary   Relevant Medications   amiodarone (PACERONE) 200 MG tablet   Other Relevant Orders   PCV ECHOCARDIOGRAM COMPLETE   Atrial fibrillation, currently in sinus rhythm       still in nsr to change amiodrone 200 daily   Relevant Medications   amiodarone (PACERONE) 200 MG tablet   Other Relevant Orders   PCV ECHOCARDIOGRAM COMPLETE   Mixed hyperlipidemia       Relevant Medications   amiodarone (PACERONE) 200 MG tablet   Other Relevant Orders   PCV ECHOCARDIOGRAM COMPLETE   Primary hypertension       Relevant Medications   amiodarone (PACERONE) 200 MG tablet   Other Relevant Orders   PCV ECHOCARDIOGRAM COMPLETE   Obstructive sleep apnea syndrome       Relevant Medications   amiodarone (PACERONE) 200 MG tablet   Other Relevant Orders   PCV ECHOCARDIOGRAM COMPLETE   Palpitation        was in afib earlier on monitor, and now on ekg nsr 72/min   Relevant Medications   amiodarone (PACERONE) 200 MG tablet   Other Relevant Orders  PCV ECHOCARDIOGRAM COMPLETE          Disposition:   Return in about 2 weeks (around 09/09/2023) for echo and f/u.    Total time spent: 30 minutes  Signed,  Adrian Blackwater, MD  08/26/2023 3:23 PM    [facility]

## 2023-08-27 ENCOUNTER — Encounter: Payer: Self-pay | Admitting: Cardiovascular Disease

## 2023-09-04 ENCOUNTER — Ambulatory Visit: Payer: Medicare HMO

## 2023-09-04 DIAGNOSIS — Z8679 Personal history of other diseases of the circulatory system: Secondary | ICD-10-CM

## 2023-09-04 DIAGNOSIS — R002 Palpitations: Secondary | ICD-10-CM

## 2023-09-04 DIAGNOSIS — E782 Mixed hyperlipidemia: Secondary | ICD-10-CM

## 2023-09-04 DIAGNOSIS — I361 Nonrheumatic tricuspid (valve) insufficiency: Secondary | ICD-10-CM

## 2023-09-04 DIAGNOSIS — I351 Nonrheumatic aortic (valve) insufficiency: Secondary | ICD-10-CM | POA: Diagnosis not present

## 2023-09-04 DIAGNOSIS — I4891 Unspecified atrial fibrillation: Secondary | ICD-10-CM | POA: Diagnosis not present

## 2023-09-04 DIAGNOSIS — I340 Nonrheumatic mitral (valve) insufficiency: Secondary | ICD-10-CM

## 2023-09-04 DIAGNOSIS — G4733 Obstructive sleep apnea (adult) (pediatric): Secondary | ICD-10-CM

## 2023-09-04 DIAGNOSIS — I10 Essential (primary) hypertension: Secondary | ICD-10-CM

## 2023-09-09 ENCOUNTER — Ambulatory Visit: Payer: Medicare HMO | Admitting: Cardiovascular Disease

## 2023-09-09 ENCOUNTER — Encounter: Payer: Self-pay | Admitting: Cardiovascular Disease

## 2023-09-09 VITALS — BP 140/90 | HR 66 | Ht 71.0 in | Wt 174.6 lb

## 2023-09-09 DIAGNOSIS — I10 Essential (primary) hypertension: Secondary | ICD-10-CM | POA: Diagnosis not present

## 2023-09-09 DIAGNOSIS — I340 Nonrheumatic mitral (valve) insufficiency: Secondary | ICD-10-CM | POA: Diagnosis not present

## 2023-09-09 DIAGNOSIS — Z8679 Personal history of other diseases of the circulatory system: Secondary | ICD-10-CM | POA: Diagnosis not present

## 2023-09-09 DIAGNOSIS — R002 Palpitations: Secondary | ICD-10-CM

## 2023-09-09 DIAGNOSIS — G4733 Obstructive sleep apnea (adult) (pediatric): Secondary | ICD-10-CM

## 2023-09-09 DIAGNOSIS — E782 Mixed hyperlipidemia: Secondary | ICD-10-CM

## 2023-09-09 NOTE — Progress Notes (Addendum)
Cardiology Office Note   Date:  09/09/2023   ID:  Jason Winters, Jason Winters Oct 08, 1950, MRN [id number]  PCP:  Kandyce Rud, MD  Cardiologist:  Adrian Blackwater, MD      History of Present Illness: Jason Winters is a 72 y.o. male who presents for  Chief Complaint  Patient presents with   Follow-up    2 week follow up, Echo results.    NO chest pain , SOB      Past Medical History:  Diagnosis Date   AF (atrial fibrillation) (HCC)    Depression    Dysrhythmia    Hyperlipidemia    Hypertension    RLS (restless legs syndrome)    Vertigo      Past Surgical History:  Procedure Laterality Date   COLONOSCOPY N/A 10/17/2020   Procedure: COLONOSCOPY;  Surgeon: Toledo, Boykin Nearing, MD;  Location: [facility] ENDOSCOPY;  Service: Gastroenterology;  Laterality: N/A;   HERNIA REPAIR       Current Outpatient Medications  Medication Sig Dispense Refill   acetaminophen (TYLENOL) 500 MG tablet Take 500 mg by mouth every 6 (six) hours as needed for moderate pain.     amiodarone (PACERONE) 200 MG tablet Take 1 tablet (200 mg total) by mouth daily. 30 tablet 11   buPROPion (WELLBUTRIN XL) 300 MG 24 hr tablet Take 300 mg by mouth daily.     hydrochlorothiazide (HYDRODIURIL) 12.5 MG tablet TAKE 1 TABLET BY MOUTH EVERY DAY 90 tablet 1   losartan (COZAAR) 25 MG tablet TAKE 1 TABLET BY MOUTH EVERY DAY 90 tablet 1   rosuvastatin (CRESTOR) 20 MG tablet TAKE 1 TABLET BY MOUTH EVERY DAY 90 tablet 1   XARELTO 20 MG TABS tablet TAKE 1 TABLET BY MOUTH EVERY DAY 90 tablet 1   No current facility-administered medications for this visit.    Allergies:   Patient has no known allergies.    Social History:   reports that he has never smoked. He has never used smokeless tobacco. He reports that he does not drink alcohol and does not use drugs.   Family History:  family history is not on file.    ROS:     Review of Systems  Constitutional: Negative.   HENT: Negative.    Eyes: Negative.    Respiratory: Negative.    Gastrointestinal: Negative.   Genitourinary: Negative.   Musculoskeletal: Negative.   Skin: Negative.   Neurological: Negative.   Endo/Heme/Allergies: Negative.   Psychiatric/Behavioral: Negative.    All other systems reviewed and are negative.     All other systems are reviewed and negative.    PHYSICAL EXAM: VS:  BP (!) 140/90   Pulse 66   Ht 5\' 11"  (1.803 m)   Wt 174 lb 9.6 oz (79.2 kg)   SpO2 97%   BMI 24.35 kg/m  , BMI Body mass index is 24.35 kg/m. Last weight:  Wt Readings from Last 3 Encounters:  09/09/23 174 lb 9.6 oz (79.2 kg)  08/26/23 175 lb 9.6 oz (79.7 kg)  06/11/23 177 lb 3.2 oz (80.4 kg)     Physical Exam Vitals reviewed.  Constitutional:      Appearance: Normal appearance. He is normal weight.  HENT:     Head: Normocephalic.     Nose: Nose normal.     Mouth/Throat:     Mouth: Mucous membranes are moist.  Eyes:     Pupils: Pupils are equal, round, and reactive to light.  Cardiovascular:  Rate and Rhythm: Normal rate and regular rhythm.     Pulses: Normal pulses.     Heart sounds: Normal heart sounds.  Pulmonary:     Effort: Pulmonary effort is normal.  Abdominal:     General: Abdomen is flat. Bowel sounds are normal.  Musculoskeletal:        General: Normal range of motion.     Cervical back: Normal range of motion.  Skin:    General: Skin is warm.  Neurological:     General: No focal deficit present.     Mental Status: He is alert.  Psychiatric:        Mood and Affect: Mood normal.       EKG:   Recent Labs: No results found for requested labs within last 365 days.    Lipid Panel No results found for: "CHOL", "TRIG", "HDL", "CHOLHDL", "VLDL", "LDLCALC", "LDLDIRECT"    Other studies Reviewed: Additional studies/ records that were reviewed today include:  Review of the above records demonstrates:       No data to display            ASSESSMENT AND PLAN:    ICD-10-CM   1. Nonrheumatic  mitral valve regurgitation  I34.0     2. Atrial fibrillation, currently in sinus rhythm  Z86.79    Was in afib but now in NSR at 200 amio daily    3. Mixed hyperlipidemia  E78.2     4. Primary hypertension  I10    Repeat LVEF65- 70% on echo    5. Palpitation  R00.2    No longer having it    6. Obstructive sleep apnea syndrome  G47.33        Problem List Items Addressed This Visit   None Visit Diagnoses       Nonrheumatic mitral valve regurgitation    -  Primary     Atrial fibrillation, currently in sinus rhythm       Was in afib but now in NSR at 200 amio daily     Mixed hyperlipidemia         Primary hypertension       Repeat LVEF65- 70% on echo     Palpitation       No longer having it     Obstructive sleep apnea syndrome              Disposition:   Return in about 2 months (around 11/10/2023).    Total time spent: 30 minutes  Signed,  Adrian Blackwater, MD  09/09/2023 1:20 PM    [facility]

## 2023-09-13 ENCOUNTER — Ambulatory Visit: Payer: Medicare HMO | Admitting: Cardiovascular Disease

## 2023-11-11 ENCOUNTER — Ambulatory Visit: Payer: Medicare HMO | Admitting: Cardiovascular Disease

## 2023-11-21 ENCOUNTER — Encounter: Payer: Self-pay | Admitting: Cardiovascular Disease

## 2023-11-21 ENCOUNTER — Ambulatory Visit: Payer: Medicare HMO | Admitting: Cardiovascular Disease

## 2023-11-21 VITALS — BP 140/89 | HR 67 | Ht 71.0 in | Wt 173.0 lb

## 2023-11-21 DIAGNOSIS — E782 Mixed hyperlipidemia: Secondary | ICD-10-CM

## 2023-11-21 DIAGNOSIS — Z8679 Personal history of other diseases of the circulatory system: Secondary | ICD-10-CM | POA: Diagnosis not present

## 2023-11-21 DIAGNOSIS — I340 Nonrheumatic mitral (valve) insufficiency: Secondary | ICD-10-CM | POA: Diagnosis not present

## 2023-11-21 DIAGNOSIS — G4733 Obstructive sleep apnea (adult) (pediatric): Secondary | ICD-10-CM

## 2023-11-21 DIAGNOSIS — I10 Essential (primary) hypertension: Secondary | ICD-10-CM | POA: Diagnosis not present

## 2023-11-21 DIAGNOSIS — R002 Palpitations: Secondary | ICD-10-CM

## 2023-11-21 MED ORDER — LOSARTAN POTASSIUM 50 MG PO TABS
50.0000 mg | ORAL_TABLET | Freq: Two times a day (BID) | ORAL | 11 refills | Status: DC
Start: 2023-11-21 — End: 2024-03-23

## 2023-11-21 NOTE — Progress Notes (Signed)
 Cardiology Office Note   Date:  11/21/2023   ID:  Jason Winters, DOB [birth year], MRN [id number]  PCP:  Kandyce Rud, MD  Cardiologist:  Adrian Blackwater, MD      History of Present Illness: Jason Winters is a 73 y.o. male who presents for  Chief Complaint  Patient presents with   Follow-up    2 month follow up     Denies chest pain and SOB      Past Medical History:  Diagnosis Date   AF (atrial fibrillation) (HCC)    Depression    Dysrhythmia    Hyperlipidemia    Hypertension    RLS (restless legs syndrome)    Vertigo      Past Surgical History:  Procedure Laterality Date   COLONOSCOPY N/A 10/17/2020   Procedure: COLONOSCOPY;  Surgeon: Toledo, Boykin Nearing, MD;  Location: [facility] END[facility]COPY;  Service: Gastroenterology;  Laterality: N/A;   HERNIA REPAIR       Current Outpatient Medications  Medication Sig Dispense Refill   acetaminophen (TYLENOL) 500 MG tablet Take 500 mg by mouth every 6 (six) hours as needed for moderate pain.     amiodarone (PACERONE) 200 MG tablet Take 1 tablet (200 mg total) by mouth daily. 30 tablet 11   buPROPion (WELLBUTRIN XL) 300 MG 24 hr tablet Take 300 mg by mouth daily.     losartan (COZAAR) 50 MG tablet Take 1 tablet (50 mg total) by mouth 2 (two) times daily. 60 tablet 11   nortriptyline (PAMELOR) 25 MG capsule Take 25 mg by mouth daily. Take 1 capsule by mouth at bedtime     rosuvastatin (CRESTOR) 20 MG tablet TAKE 1 TABLET BY MOUTH EVERY DAY 90 tablet 1   XARELTO 20 MG TABS tablet TAKE 1 TABLET BY MOUTH EVERY DAY 90 tablet 1   No current facility-administered medications for this visit.    Allergies:   Patient has no known allergies.    Social History:   reports that he has never smoked. He has never used smokeless tobacco. He reports that he does not drink alcohol and does not use drugs.   Family History:  family history is not on file.    ROS:     Review of Systems  Constitutional: Negative.   HENT: Negative.     Eyes: Negative.   Respiratory: Negative.    Gastrointestinal: Negative.   Genitourinary: Negative.   Musculoskeletal: Negative.   Skin: Negative.   Neurological: Negative.   Endo/Heme/Allergies: Negative.   Psychiatric/Behavioral: Negative.    All other systems reviewed and are negative.     All other systems are reviewed and negative.    PHYSICAL EXAM: VS:  BP (!) 140/89   Pulse 67   Ht 5\' 11"  (1.803 m)   Wt 173 lb (78.5 kg)   SpO2 98%   BMI 24.13 kg/m  , BMI Body mass index is 24.13 kg/m. Last weight:  Wt Readings from Last 3 Encounters:  11/21/23 173 lb (78.5 kg)  09/09/23 174 lb 9.6 oz (79.2 kg)  08/26/23 175 lb 9.6 oz (79.7 kg)     Physical Exam Vitals reviewed.  Constitutional:      Appearance: Normal appearance. He is normal weight.  HENT:     Head: Normocephalic.     Nose: Nose normal.     Mouth/Throat:     Mouth: Mucous membranes are moist.  Eyes:     Pupils: Pupils are equal, round, and reactive to light.  Cardiovascular:     Rate and Rhythm: Normal rate and regular rhythm.     Pulses: Normal pulses.     Heart sounds: Normal heart sounds.  Pulmonary:     Effort: Pulmonary effort is normal.  Abdominal:     General: Abdomen is flat. Bowel sounds are normal.  Musculoskeletal:        General: Normal range of motion.     Cervical back: Normal range of motion.  Skin:    General: Skin is warm.  Neurological:     General: No focal deficit present.     Mental Status: He is alert.  Psychiatric:        Mood and Affect: Mood normal.       EKG:   Recent Labs: No results found for requested labs within last 365 days.    Lipid Panel No results found for: "CHOL", "TRIG", "HDL", "CHOLHDL", "VLDL", "LDLCALC", "LDLDIRECT"    Other studies Reviewed: Additional studies/ records that were reviewed today include:  Review of the above records demonstrates:       No data to display            ASSESSMENT AND PLAN:    ICD-10-CM   1.  Nonrheumatic mitral valve regurgitation  I34.0 losartan (COZAAR) 50 MG tablet    2. Atrial fibrillation, currently in sinus rhythm  Z86.79 losartan (COZAAR) 50 MG tablet    3. Mixed hyperlipidemia  E78.2 losartan (COZAAR) 50 MG tablet    4. Palpitation  R00.2 losartan (COZAAR) 50 MG tablet    5. Primary hypertension  I10 losartan (COZAAR) 50 MG tablet   HCTZ was held by his primary and BP is high today, so will increase losartan to 50 daily    6. Obstructive sleep apnea syndrome  G47.33 losartan (COZAAR) 50 MG tablet       Problem List Items Addressed This Visit   None Visit Diagnoses       Nonrheumatic mitral valve regurgitation    -  Primary   Relevant Medications   losartan (COZAAR) 50 MG tablet     Atrial fibrillation, currently in sinus rhythm       Relevant Medications   losartan (COZAAR) 50 MG tablet     Mixed hyperlipidemia       Relevant Medications   losartan (COZAAR) 50 MG tablet     Palpitation       Relevant Medications   losartan (COZAAR) 50 MG tablet     Primary hypertension       HCTZ was held by his primary and BP is high today, so will increase losartan to 50 daily   Relevant Medications   losartan (COZAAR) 50 MG tablet     Obstructive sleep apnea syndrome       Relevant Medications   losartan (COZAAR) 50 MG tablet          Disposition:   Return in about 4 weeks (around 12/19/2023).    Total time spent: 30 minutes  Signed,  Adrian Blackwater, MD  11/21/2023 10:12 AM    [facility]

## 2023-12-10 ENCOUNTER — Other Ambulatory Visit: Payer: Self-pay | Admitting: Cardiovascular Disease

## 2023-12-10 DIAGNOSIS — I4891 Unspecified atrial fibrillation: Secondary | ICD-10-CM

## 2023-12-20 ENCOUNTER — Other Ambulatory Visit: Payer: Self-pay

## 2023-12-20 ENCOUNTER — Ambulatory Visit: Admitting: Cardiovascular Disease

## 2023-12-20 ENCOUNTER — Encounter: Payer: Self-pay | Admitting: Cardiovascular Disease

## 2023-12-20 VITALS — BP 122/78 | HR 65 | Ht 71.0 in | Wt 177.0 lb

## 2023-12-20 DIAGNOSIS — I10 Essential (primary) hypertension: Secondary | ICD-10-CM | POA: Diagnosis not present

## 2023-12-20 DIAGNOSIS — Z8679 Personal history of other diseases of the circulatory system: Secondary | ICD-10-CM

## 2023-12-20 DIAGNOSIS — E782 Mixed hyperlipidemia: Secondary | ICD-10-CM | POA: Diagnosis not present

## 2023-12-20 DIAGNOSIS — I340 Nonrheumatic mitral (valve) insufficiency: Secondary | ICD-10-CM

## 2023-12-20 DIAGNOSIS — R002 Palpitations: Secondary | ICD-10-CM

## 2023-12-20 DIAGNOSIS — I4891 Unspecified atrial fibrillation: Secondary | ICD-10-CM

## 2023-12-20 MED ORDER — RIVAROXABAN 20 MG PO TABS: 20.0000 mg | ORAL_TABLET | Freq: Every day | ORAL | 1 refills | Status: AC

## 2023-12-20 NOTE — Progress Notes (Signed)
 Cardiology Office Note   Date:  12/20/2023   ID:  Jason Winters, DOB 1950/10/30, MRN [id number]  PCP:  Kandyce Rud, MD  Cardiologist:  Adrian Blackwater, MD      History of Present Illness: Jason Winters is a 73 y.o. male who presents for  Chief Complaint  Patient presents with   Follow-up    4 week follow up   New RX not sent in at last visit- pt has been taking 2 20mg  Rosuvastatin X 4 weeks     Doing well, less SOB with Cpap      Past Medical History:  Diagnosis Date   AF (atrial fibrillation) (HCC)    Depression    Dysrhythmia    Hyperlipidemia    Hypertension    RLS (restless legs syndrome)    Vertigo      Past Surgical History:  Procedure Laterality Date   COLONOSCOPY N/A 10/17/2020   Procedure: COLONOSCOPY;  Surgeon: Toledo, Boykin Nearing, MD;  Location: [facility] END[facility];  Service: Gastroenterology;  Laterality: N/A;   HERNIA REPAIR       Current Outpatient Medications  Medication Sig Dispense Refill   acetaminophen (TYLENOL) 500 MG tablet Take 500 mg by mouth every 6 (six) hours as needed for moderate pain.     amiodarone (PACERONE) 200 MG tablet Take 1 tablet (200 mg total) by mouth daily. 30 tablet 11   buPROPion (WELLBUTRIN XL) 300 MG 24 hr tablet Take 300 mg by mouth daily.     losartan (COZAAR) 50 MG tablet Take 1 tablet (50 mg total) by mouth 2 (two) times daily. 60 tablet 11   nortriptyline (PAMELOR) 25 MG capsule Take 25 mg by mouth daily. Take 1 capsule by mouth at bedtime     rosuvastatin (CRESTOR) 20 MG tablet TAKE 1 TABLET BY MOUTH EVERY DAY 90 tablet 1   XARELTO 20 MG TABS tablet TAKE 1 TABLET BY MOUTH EVERY DAY 90 tablet 1   No current facility-administered medications for this visit.    Allergies:   Patient has no known allergies.    Social History:   reports that he has never smoked. He has never used smokeless tobacco. He reports that he does not drink alcohol and does not use drugs.   Family History:  family history is not on  file.    ROS:     Review of Systems  Constitutional: Negative.   HENT: Negative.    Eyes: Negative.   Respiratory: Negative.    Gastrointestinal: Negative.   Genitourinary: Negative.   Musculoskeletal: Negative.   Skin: Negative.   Neurological: Negative.   Endo/Heme/Allergies: Negative.   Psychiatric/Behavioral: Negative.    All other systems reviewed and are negative.     All other systems are reviewed and negative.    PHYSICAL EXAM: VS:  BP 122/78   Pulse 65   Ht 5\' 11"  (1.803 m)   Wt 177 lb (80.3 kg)   SpO2 98%   BMI 24.69 kg/m  , BMI Body mass index is 24.69 kg/m. Last weight:  Wt Readings from Last 3 Encounters:  12/20/23 177 lb (80.3 kg)  11/21/23 173 lb (78.5 kg)  09/09/23 174 lb 9.6 oz (79.2 kg)     Physical Exam Vitals reviewed.  Constitutional:      Appearance: Normal appearance. He is normal weight.  HENT:     Head: Normocephalic.     Nose: Nose normal.     Mouth/Throat:     Mouth: Mucous  membranes are moist.  Eyes:     Pupils: Pupils are equal, round, and reactive to light.  Cardiovascular:     Rate and Rhythm: Normal rate and regular rhythm.     Pulses: Normal pulses.     Heart sounds: Normal heart sounds.  Pulmonary:     Effort: Pulmonary effort is normal.  Abdominal:     General: Abdomen is flat. Bowel sounds are normal.  Musculoskeletal:        General: Normal range of motion.     Cervical back: Normal range of motion.  Skin:    General: Skin is warm.  Neurological:     General: No focal deficit present.     Mental Status: He is alert.  Psychiatric:        Mood and Affect: Mood normal.       EKG:   Recent Labs: No results found for requested labs within last 365 days.    Lipid Panel No results found for: "CHOL", "TRIG", "HDL", "CHOLHDL", "VLDL", "LDLCALC", "LDLDIRECT"    Other studies Reviewed: Additional studies/ records that were reviewed today include:  Review of the above records demonstrates:       No data  to display            ASSESSMENT AND PLAN:    ICD-10-CM   1. Nonrheumatic mitral valve regurgitation  I34.0     2. Atrial fibrillation, currently in sinus rhythm  Z86.79    In NSR    3. Mixed hyperlipidemia  E78.2     4. Palpitation  R00.2     5. Primary hypertension  I10    contolled       Problem List Items Addressed This Visit   None Visit Diagnoses       Nonrheumatic mitral valve regurgitation    -  Primary     Atrial fibrillation, currently in sinus rhythm       In NSR     Mixed hyperlipidemia         Palpitation         Primary hypertension       contolled          Disposition:   Return in about 3 months (around 03/20/2024).    Total time spent: 30 minutes  Signed,  Adrian Blackwater, MD  12/20/2023 9:26 AM    [facility]

## 2024-01-15 ENCOUNTER — Other Ambulatory Visit: Payer: Self-pay | Admitting: Cardiovascular Disease

## 2024-03-23 ENCOUNTER — Ambulatory Visit: Admitting: Cardiovascular Disease

## 2024-03-23 ENCOUNTER — Encounter: Payer: Self-pay | Admitting: Cardiovascular Disease

## 2024-03-23 VITALS — BP 120/79 | HR 69 | Ht 71.0 in | Wt 177.0 lb

## 2024-03-23 DIAGNOSIS — R002 Palpitations: Secondary | ICD-10-CM

## 2024-03-23 DIAGNOSIS — Z8679 Personal history of other diseases of the circulatory system: Secondary | ICD-10-CM

## 2024-03-23 DIAGNOSIS — I10 Essential (primary) hypertension: Secondary | ICD-10-CM

## 2024-03-23 DIAGNOSIS — I340 Nonrheumatic mitral (valve) insufficiency: Secondary | ICD-10-CM

## 2024-03-23 DIAGNOSIS — E782 Mixed hyperlipidemia: Secondary | ICD-10-CM | POA: Diagnosis not present

## 2024-03-23 DIAGNOSIS — G4733 Obstructive sleep apnea (adult) (pediatric): Secondary | ICD-10-CM

## 2024-03-23 MED ORDER — LOSARTAN POTASSIUM 50 MG PO TABS: 50.0000 mg | ORAL_TABLET | Freq: Every day | ORAL | 11 refills | Status: DC

## 2024-03-23 NOTE — Progress Notes (Signed)
 Cardiology Office Note   Date:  03/23/2024   ID:  Jason Winters, Jason Winters 1951/07/31, MRN [id number]  PCP:  Jason Lame, MD  Cardiologist:  Denyse Bathe, MD      History of Present Illness: Jason Winters is a 73 y.o. male who presents for  Chief Complaint  Patient presents with   Follow-up    3 month follow up    Doing well      Past Medical History:  Diagnosis Date   AF (atrial fibrillation) (HCC)    Depression    Dysrhythmia    Hyperlipidemia    Hypertension    RLS (restless legs syndrome)    Vertigo      Past Surgical History:  Procedure Laterality Date   COLONOSCOPY N/A 10/17/2020   Procedure: COLONOSCOPY;  Surgeon: Toledo, Ladell POUR, MD;  Location: [facility] ENDOSCOPY;  Service: Gastroenterology;  Laterality: N/A;   HERNIA REPAIR       Current Outpatient Medications  Medication Sig Dispense Refill   acetaminophen (TYLENOL) 500 MG tablet Take 500 mg by mouth every 6 (six) hours as needed for moderate pain.     amiodarone  (PACERONE ) 200 MG tablet Take 1 tablet (200 mg total) by mouth daily. 30 tablet 11   buPROPion (WELLBUTRIN XL) 300 MG 24 hr tablet Take 300 mg by mouth daily.     nortriptyline (PAMELOR) 25 MG capsule Take 25 mg by mouth daily. Take 1 capsule by mouth at bedtime     rivaroxaban  (XARELTO ) 20 MG TABS tablet Take 1 tablet (20 mg total) by mouth daily. 90 tablet 1   rosuvastatin (CRESTOR) 20 MG tablet TAKE 1 TABLET BY MOUTH EVERY DAY 90 tablet 1   losartan  (COZAAR ) 50 MG tablet Take 1 tablet (50 mg total) by mouth daily. 60 tablet 11   No current facility-administered medications for this visit.    Allergies:   Patient has no known allergies.    Social History:   reports that he has never smoked. He has never used smokeless tobacco. He reports that he does not drink alcohol and does not use drugs.   Family History:  family history is not on file.    ROS:     Review of Systems  Constitutional: Negative.   HENT: Negative.    Eyes:  Negative.   Respiratory: Negative.    Gastrointestinal: Negative.   Genitourinary: Negative.   Musculoskeletal: Negative.   Skin: Negative.   Neurological: Negative.   Endo/Heme/Allergies: Negative.   Psychiatric/Behavioral: Negative.    All other systems reviewed and are negative.     All other systems are reviewed and negative.    PHYSICAL EXAM: VS:  BP 120/79   Pulse 69   Ht 5' 11 (1.803 m)   Wt 177 lb (80.3 kg)   SpO2 96%   BMI 24.69 kg/m  , BMI Body mass index is 24.69 kg/m. Last weight:  Wt Readings from Last 3 Encounters:  03/23/24 177 lb (80.3 kg)  12/20/23 177 lb (80.3 kg)  11/21/23 173 lb (78.5 kg)     Physical Exam Vitals reviewed.  Constitutional:      Appearance: Normal appearance. He is normal weight.  HENT:     Head: Normocephalic.     Nose: Nose normal.     Mouth/Throat:     Mouth: Mucous membranes are moist.  Eyes:     Pupils: Pupils are equal, round, and reactive to light.  Cardiovascular:     Rate and Rhythm:  Normal rate and regular rhythm.     Pulses: Normal pulses.     Heart sounds: Normal heart sounds.  Pulmonary:     Effort: Pulmonary effort is normal.  Abdominal:     General: Abdomen is flat. Bowel sounds are normal.  Musculoskeletal:        General: Normal range of motion.     Cervical back: Normal range of motion.  Skin:    General: Skin is warm.  Neurological:     General: No focal deficit present.     Mental Status: He is alert.  Psychiatric:        Mood and Affect: Mood normal.       EKG:   Recent Labs: No results found for requested labs within last 365 days.    Lipid Panel No results found for: CHOL, TRIG, HDL, CHOLHDL, VLDL, LDLCALC, LDLDIRECT    Other studies Reviewed: Additional studies/ records that were reviewed today include:  Review of the above records demonstrates:       No data to display            ASSESSMENT AND PLAN:    ICD-10-CM   1. Nonrheumatic mitral valve  regurgitation  I34.0 losartan  (COZAAR ) 50 MG tablet    2. Atrial fibrillation, currently in sinus rhythm  Z86.79 losartan  (COZAAR ) 50 MG tablet   stable on xarelto  for stroke prevention.    3. Mixed hyperlipidemia  E78.2 losartan  (COZAAR ) 50 MG tablet    4. Palpitation  R00.2 losartan  (COZAAR ) 50 MG tablet    5. Primary hypertension  I10 losartan  (COZAAR ) 50 MG tablet   stable    6. Obstructive sleep apnea syndrome  G47.33 losartan  (COZAAR ) 50 MG tablet    7. Atrial fibrillation, currently in sinus rhythm  Z86.79 losartan  (COZAAR ) 50 MG tablet    8. Primary hypertension  I10 losartan  (COZAAR ) 50 MG tablet   HCTZ was held by his primary and BP is high today, so will increase losartan  to 50 daily       Problem List Items Addressed This Visit   None Visit Diagnoses       Nonrheumatic mitral valve regurgitation    -  Primary   Relevant Medications   losartan  (COZAAR ) 50 MG tablet     Atrial fibrillation, currently in sinus rhythm       stable on xarelto  for stroke prevention.   Relevant Medications   losartan  (COZAAR ) 50 MG tablet     Mixed hyperlipidemia       Relevant Medications   losartan  (COZAAR ) 50 MG tablet     Palpitation       Relevant Medications   losartan  (COZAAR ) 50 MG tablet     Primary hypertension       stable   Relevant Medications   losartan  (COZAAR ) 50 MG tablet     Obstructive sleep apnea syndrome       Relevant Medications   losartan  (COZAAR ) 50 MG tablet     Atrial fibrillation, currently in sinus rhythm       Relevant Medications   losartan  (COZAAR ) 50 MG tablet     Primary hypertension       HCTZ was held by his primary and BP is high today, so will increase losartan  to 50 daily   Relevant Medications   losartan  (COZAAR ) 50 MG tablet          Disposition:   Return in about 3 months (around 06/23/2024).    Total time  spent: 30 minutes  Signed,  Denyse Bathe, MD  03/23/2024 9:43 AM    [facility]

## 2024-06-22 ENCOUNTER — Encounter: Payer: Self-pay | Admitting: Cardiovascular Disease

## 2024-06-22 ENCOUNTER — Ambulatory Visit (INDEPENDENT_AMBULATORY_CARE_PROVIDER_SITE_OTHER): Admitting: Cardiovascular Disease

## 2024-06-22 VITALS — BP 128/70 | HR 61 | Ht 71.0 in | Wt 171.8 lb

## 2024-06-22 DIAGNOSIS — I10 Essential (primary) hypertension: Secondary | ICD-10-CM | POA: Diagnosis not present

## 2024-06-22 DIAGNOSIS — E782 Mixed hyperlipidemia: Secondary | ICD-10-CM | POA: Diagnosis not present

## 2024-06-22 DIAGNOSIS — G4733 Obstructive sleep apnea (adult) (pediatric): Secondary | ICD-10-CM | POA: Diagnosis not present

## 2024-06-22 DIAGNOSIS — Z8679 Personal history of other diseases of the circulatory system: Secondary | ICD-10-CM

## 2024-06-22 DIAGNOSIS — I340 Nonrheumatic mitral (valve) insufficiency: Secondary | ICD-10-CM

## 2024-06-22 DIAGNOSIS — R002 Palpitations: Secondary | ICD-10-CM | POA: Diagnosis not present

## 2024-06-22 NOTE — Progress Notes (Signed)
 Cardiology Office Note   Date:  06/22/2024   ID:  Leelyn, Jasinski 10/14/1950, MRN [id number]  PCP:  Diedra Lame, MD  Cardiologist:  Denyse Bathe, MD      History of Present Illness: Jason Winters is a 73 y.o. male who presents for  Chief Complaint  Patient presents with   Follow-up    3 month follow up    No complains.      Past Medical History:  Diagnosis Date   AF (atrial fibrillation) (HCC)    Depression    Dysrhythmia    Hyperlipidemia    Hypertension    RLS (restless legs syndrome)    Vertigo      Past Surgical History:  Procedure Laterality Date   COLONOSCOPY N/A 10/17/2020   Procedure: COLONOSCOPY;  Surgeon: Toledo, Ladell POUR, MD;  Location: [facility];  Service: Gastroenterology;  Laterality: N/A;   HERNIA REPAIR       Current Outpatient Medications  Medication Sig Dispense Refill   acetaminophen (TYLENOL) 500 MG tablet Take 500 mg by mouth every 6 (six) hours as needed for moderate pain.     amiodarone  (PACERONE ) 200 MG tablet Take 1 tablet (200 mg total) by mouth daily. 30 tablet 11   buPROPion (WELLBUTRIN XL) 300 MG 24 hr tablet Take 300 mg by mouth daily.     losartan  (COZAAR ) 50 MG tablet Take 1 tablet (50 mg total) by mouth daily. 60 tablet 11   nortriptyline (PAMELOR) 25 MG capsule Take 25 mg by mouth daily. Take 1 capsule by mouth at bedtime     rivaroxaban  (XARELTO ) 20 MG TABS tablet Take 1 tablet (20 mg total) by mouth daily. 90 tablet 1   rosuvastatin (CRESTOR) 20 MG tablet TAKE 1 TABLET BY MOUTH EVERY DAY 90 tablet 1   No current facility-administered medications for this visit.    Allergies:   Patient has no known allergies.    Social History:   reports that he has never smoked. He has never used smokeless tobacco. He reports that he does not drink alcohol and does not use drugs.   Family History:  family history is not on file.    ROS:     Review of Systems  Constitutional: Negative.   HENT: Negative.     Eyes: Negative.   Respiratory: Negative.    Gastrointestinal: Negative.   Genitourinary: Negative.   Musculoskeletal: Negative.   Skin: Negative.   Neurological: Negative.   Endo/Heme/Allergies: Negative.   Psychiatric/Behavioral: Negative.    All other systems reviewed and are negative.     All other systems are reviewed and negative.    PHYSICAL EXAM: VS:  BP 128/70   Pulse 61   Ht 5' 11 (1.803 m)   Wt 171 lb 12.8 oz (77.9 kg)   SpO2 98%   BMI 23.96 kg/m  , BMI Body mass index is 23.96 kg/m. Last weight:  Wt Readings from Last 3 Encounters:  06/22/24 171 lb 12.8 oz (77.9 kg)  03/23/24 177 lb (80.3 kg)  12/20/23 177 lb (80.3 kg)     Physical Exam Vitals reviewed.  Constitutional:      Appearance: Normal appearance. He is normal weight.  HENT:     Head: Normocephalic.     Nose: Nose normal.     Mouth/Throat:     Mouth: Mucous membranes are moist.  Eyes:     Pupils: Pupils are equal, round, and reactive to light.  Cardiovascular:  Rate and Rhythm: Normal rate and regular rhythm.     Pulses: Normal pulses.     Heart sounds: Normal heart sounds.  Pulmonary:     Effort: Pulmonary effort is normal.  Abdominal:     General: Abdomen is flat. Bowel sounds are normal.  Musculoskeletal:        General: Normal range of motion.     Cervical back: Normal range of motion.  Skin:    General: Skin is warm.  Neurological:     General: No focal deficit present.     Mental Status: He is alert.  Psychiatric:        Mood and Affect: Mood normal.       EKG:   Recent Labs: No results found for requested labs within last 365 days.    Lipid Panel No results found for: CHOL, TRIG, HDL, CHOLHDL, VLDL, LDLCALC, LDLDIRECT    Other studies Reviewed: Additional studies/ records that were reviewed today include:  Review of the above records demonstrates:       No data to display            ASSESSMENT AND PLAN:    ICD-10-CM   1. Mixed  hyperlipidemia  E78.2     2. Primary hypertension  I10     3. Palpitation  R00.2     4. Obstructive sleep apnea syndrome  G47.33     5. Atrial fibrillation, currently in sinus rhythm  Z86.79    in NSR, amiodrone 200 mg daily.    6. Nonrheumatic mitral valve regurgitation  I34.0        Problem List Items Addressed This Visit   None Visit Diagnoses       Mixed hyperlipidemia    -  Primary     Primary hypertension         Palpitation         Obstructive sleep apnea syndrome         Atrial fibrillation, currently in sinus rhythm       in NSR, amiodrone 200 mg daily.     Nonrheumatic mitral valve regurgitation              Disposition:   Return in about 2 months (around 08/22/2024).    Total time spent: 35 minutes  Signed,  Denyse Bathe, MD  06/22/2024 9:53 AM    [facility]

## 2024-07-22 ENCOUNTER — Other Ambulatory Visit: Payer: Self-pay | Admitting: Cardiovascular Disease

## 2024-08-25 ENCOUNTER — Encounter: Payer: Self-pay | Admitting: Cardiovascular Disease

## 2024-08-25 ENCOUNTER — Ambulatory Visit: Admitting: Cardiovascular Disease

## 2024-08-25 VITALS — BP 160/82 | HR 71 | Ht 71.0 in | Wt 172.0 lb

## 2024-08-25 DIAGNOSIS — Z8679 Personal history of other diseases of the circulatory system: Secondary | ICD-10-CM

## 2024-08-25 DIAGNOSIS — I340 Nonrheumatic mitral (valve) insufficiency: Secondary | ICD-10-CM

## 2024-08-25 DIAGNOSIS — R55 Syncope and collapse: Secondary | ICD-10-CM

## 2024-08-25 DIAGNOSIS — G4733 Obstructive sleep apnea (adult) (pediatric): Secondary | ICD-10-CM

## 2024-08-25 DIAGNOSIS — R002 Palpitations: Secondary | ICD-10-CM

## 2024-08-25 DIAGNOSIS — E782 Mixed hyperlipidemia: Secondary | ICD-10-CM

## 2024-08-25 DIAGNOSIS — I10 Essential (primary) hypertension: Secondary | ICD-10-CM

## 2024-08-25 MED ORDER — LOSARTAN POTASSIUM-HCTZ 100-25 MG PO TABS: 1.0000 | ORAL_TABLET | Freq: Every day | ORAL | 11 refills | Status: AC

## 2024-08-25 MED ORDER — LOSARTAN POTASSIUM 100 MG PO TABS: 100.0000 mg | ORAL_TABLET | Freq: Every day | ORAL | 11 refills | Status: DC

## 2024-08-25 NOTE — Progress Notes (Signed)
 Cardiology Office Note   Date:  08/25/2024   ID:  Hansen, Carino [birth year], MRN [id number]  PCP:  Diedra Lame, MD  Cardiologist:  Denyse Bathe, MD      History of Present Illness: Jason Winters is a 73 y.o. male who presents for  Chief Complaint  Patient presents with   Follow-up    2 months follow up    Had elevated BP, no headaches. Had episode while setting up christmas tree with arms up, felt going to pass out.      Past Medical History:  Diagnosis Date   AF (atrial fibrillation) (HCC)    Depression    Dysrhythmia    Hyperlipidemia    Hypertension    RLS (restless legs syndrome)    Vertigo      Past Surgical History:  Procedure Laterality Date   COLONOSCOPY N/A 10/17/2020   Procedure: COLONOSCOPY;  Surgeon: Toledo, Ladell POUR, MD;  Location: [facility];  Service: Gastroenterology;  Laterality: N/A;   HERNIA REPAIR       Current Outpatient Medications  Medication Sig Dispense Refill   acetaminophen (TYLENOL) 500 MG tablet Take 500 mg by mouth every 6 (six) hours as needed for moderate pain.     amiodarone  (PACERONE ) 200 MG tablet Take 1 tablet (200 mg total) by mouth daily. 30 tablet 11   buPROPion (WELLBUTRIN XL) 300 MG 24 hr tablet Take 300 mg by mouth daily.     losartan  (COZAAR ) 100 MG tablet Take 1 tablet (100 mg total) by mouth daily. 30 tablet 11   nortriptyline (PAMELOR) 25 MG capsule Take 25 mg by mouth daily. Take 1 capsule by mouth at bedtime     rivaroxaban  (XARELTO ) 20 MG TABS tablet Take 1 tablet (20 mg total) by mouth daily. 90 tablet 1   rosuvastatin (CRESTOR) 20 MG tablet TAKE 1 TABLET BY MOUTH EVERY DAY 90 tablet 1   No current facility-administered medications for this visit.    Allergies:   Patient has no known allergies.    Social History:   reports that he has never smoked. He has never used smokeless tobacco. He reports that he does not drink alcohol and does not use drugs.   Family History:  family history is  not on file.    ROS:     Review of Systems  Constitutional: Negative.   HENT: Negative.    Eyes: Negative.   Respiratory: Negative.    Gastrointestinal: Negative.   Genitourinary: Negative.   Musculoskeletal: Negative.   Skin: Negative.   Neurological: Negative.   Endo/Heme/Allergies: Negative.   Psychiatric/Behavioral: Negative.    All other systems reviewed and are negative.     All other systems are reviewed and negative.    PHYSICAL EXAM: VS:  BP (!) 160/82   Pulse 71   Ht 5' 11 (1.803 m)   Wt 172 lb (78 kg)   SpO2 94%   BMI 23.99 kg/m  , BMI Body mass index is 23.99 kg/m. Last weight:  Wt Readings from Last 3 Encounters:  08/25/24 172 lb (78 kg)  06/22/24 171 lb 12.8 oz (77.9 kg)  03/23/24 177 lb (80.3 kg)     Physical Exam Vitals reviewed.  Constitutional:      Appearance: Normal appearance. He is normal weight.  HENT:     Head: Normocephalic.     Nose: Nose normal.     Mouth/Throat:     Mouth: Mucous membranes are moist.  Eyes:  Pupils: Pupils are equal, round, and reactive to light.  Cardiovascular:     Rate and Rhythm: Normal rate and regular rhythm.     Pulses: Normal pulses.     Heart sounds: Normal heart sounds.  Pulmonary:     Effort: Pulmonary effort is normal.  Abdominal:     General: Abdomen is flat. Bowel sounds are normal.  Musculoskeletal:        General: Normal range of motion.     Cervical back: Normal range of motion.  Skin:    General: Skin is warm.  Neurological:     General: No focal deficit present.     Mental Status: He is alert.  Psychiatric:        Mood and Affect: Mood normal.       EKG:   Recent Labs: No results found for requested labs within last 365 days.    Lipid Panel No results found for: CHOL, TRIG, HDL, CHOLHDL, VLDL, LDLCALC, LDLDIRECT    Other studies Reviewed: Additional studies/ records that were reviewed today include:  Review of the above records demonstrates:        No data to display            ASSESSMENT AND PLAN:    ICD-10-CM   1. Mixed hyperlipidemia  E78.2 PCV ECHOCARDIOGRAM COMPLETE    US  Carotid Bilateral    losartan  (COZAAR ) 100 MG tablet    2. Primary hypertension  I10 PCV ECHOCARDIOGRAM COMPLETE    US  Carotid Bilateral    losartan  (COZAAR ) 100 MG tablet   uncontollred HTN, increase losartan  100 daily. Do echo, caroted dopplers.    3. Palpitation  R00.2 PCV ECHOCARDIOGRAM COMPLETE    US  Carotid Bilateral    losartan  (COZAAR ) 100 MG tablet    4. Obstructive sleep apnea syndrome  G47.33 PCV ECHOCARDIOGRAM COMPLETE    US  Carotid Bilateral    losartan  (COZAAR ) 100 MG tablet    5. Atrial fibrillation, currently in sinus rhythm  Z86.79 PCV ECHOCARDIOGRAM COMPLETE    US  Carotid Bilateral    losartan  (COZAAR ) 100 MG tablet    6. Nonrheumatic mitral valve regurgitation  I34.0 PCV ECHOCARDIOGRAM COMPLETE    US  Carotid Bilateral    losartan  (COZAAR ) 100 MG tablet    7. Syncope, unspecified syncope type  R55 PCV ECHOCARDIOGRAM COMPLETE    US  Carotid Bilateral    losartan  (COZAAR ) 100 MG tablet   Advise caroted dopplers.       Problem List Items Addressed This Visit   None Visit Diagnoses       Mixed hyperlipidemia    -  Primary   Relevant Medications   losartan  (COZAAR ) 100 MG tablet   Other Relevant Orders   PCV ECHOCARDIOGRAM COMPLETE   US  Carotid Bilateral     Primary hypertension       uncontollred HTN, increase losartan  100 daily. Do echo, caroted dopplers.   Relevant Medications   losartan  (COZAAR ) 100 MG tablet   Other Relevant Orders   PCV ECHOCARDIOGRAM COMPLETE   US  Carotid Bilateral     Palpitation       Relevant Medications   losartan  (COZAAR ) 100 MG tablet   Other Relevant Orders   PCV ECHOCARDIOGRAM COMPLETE   US  Carotid Bilateral     Obstructive sleep apnea syndrome       Relevant Medications   losartan  (COZAAR ) 100 MG tablet   Other Relevant Orders   PCV ECHOCARDIOGRAM COMPLETE   US  Carotid  Bilateral  Atrial fibrillation, currently in sinus rhythm       Relevant Medications   losartan  (COZAAR ) 100 MG tablet   Other Relevant Orders   PCV ECHOCARDIOGRAM COMPLETE   US  Carotid Bilateral     Nonrheumatic mitral valve regurgitation       Relevant Medications   losartan  (COZAAR ) 100 MG tablet   Other Relevant Orders   PCV ECHOCARDIOGRAM COMPLETE   US  Carotid Bilateral     Syncope, unspecified syncope type       Advise caroted dopplers.   Relevant Medications   losartan  (COZAAR ) 100 MG tablet   Other Relevant Orders   PCV ECHOCARDIOGRAM COMPLETE   US  Carotid Bilateral          Disposition:   Return in about 5 weeks (around 09/29/2024) for [place], do echo and visit same day and ccaroted u/s with Liaquat will have to be wednsday.    Total time spent: 35 minutes  Signed,  Denyse Bathe, MD  08/25/2024 10:31 AM    [facility]

## 2024-08-25 NOTE — Addendum Note (Signed)
 Addended by: FERNAND ALTER A on: 08/25/2024 10:33 AM   Modules accepted: Orders

## 2024-08-26 ENCOUNTER — Other Ambulatory Visit: Payer: Self-pay | Admitting: Cardiovascular Disease

## 2024-08-26 DIAGNOSIS — Z8679 Personal history of other diseases of the circulatory system: Secondary | ICD-10-CM

## 2024-08-26 DIAGNOSIS — I10 Essential (primary) hypertension: Secondary | ICD-10-CM

## 2024-08-26 DIAGNOSIS — I340 Nonrheumatic mitral (valve) insufficiency: Secondary | ICD-10-CM

## 2024-08-26 DIAGNOSIS — G4733 Obstructive sleep apnea (adult) (pediatric): Secondary | ICD-10-CM

## 2024-08-26 DIAGNOSIS — E782 Mixed hyperlipidemia: Secondary | ICD-10-CM

## 2024-08-26 DIAGNOSIS — R002 Palpitations: Secondary | ICD-10-CM

## 2024-08-27 ENCOUNTER — Other Ambulatory Visit: Payer: Self-pay

## 2024-09-02 ENCOUNTER — Ambulatory Visit

## 2024-09-02 DIAGNOSIS — I340 Nonrheumatic mitral (valve) insufficiency: Secondary | ICD-10-CM | POA: Diagnosis not present

## 2024-09-02 DIAGNOSIS — R002 Palpitations: Secondary | ICD-10-CM

## 2024-09-02 DIAGNOSIS — G4733 Obstructive sleep apnea (adult) (pediatric): Secondary | ICD-10-CM | POA: Diagnosis not present

## 2024-09-02 DIAGNOSIS — Z8679 Personal history of other diseases of the circulatory system: Secondary | ICD-10-CM

## 2024-09-02 DIAGNOSIS — R55 Syncope and collapse: Secondary | ICD-10-CM | POA: Diagnosis not present

## 2024-09-02 DIAGNOSIS — E782 Mixed hyperlipidemia: Secondary | ICD-10-CM

## 2024-09-02 DIAGNOSIS — I10 Essential (primary) hypertension: Secondary | ICD-10-CM | POA: Diagnosis not present

## 2024-09-07 ENCOUNTER — Other Ambulatory Visit

## 2024-09-07 ENCOUNTER — Ambulatory Visit: Admitting: Cardiovascular Disease

## 2024-09-07 DIAGNOSIS — I361 Nonrheumatic tricuspid (valve) insufficiency: Secondary | ICD-10-CM | POA: Diagnosis not present

## 2024-09-07 DIAGNOSIS — I340 Nonrheumatic mitral (valve) insufficiency: Secondary | ICD-10-CM | POA: Diagnosis not present

## 2024-09-07 DIAGNOSIS — G4733 Obstructive sleep apnea (adult) (pediatric): Secondary | ICD-10-CM

## 2024-09-07 DIAGNOSIS — R55 Syncope and collapse: Secondary | ICD-10-CM

## 2024-09-07 DIAGNOSIS — I371 Nonrheumatic pulmonary valve insufficiency: Secondary | ICD-10-CM | POA: Diagnosis not present

## 2024-09-07 DIAGNOSIS — R002 Palpitations: Secondary | ICD-10-CM

## 2024-09-07 DIAGNOSIS — Z8679 Personal history of other diseases of the circulatory system: Secondary | ICD-10-CM

## 2024-09-07 DIAGNOSIS — I10 Essential (primary) hypertension: Secondary | ICD-10-CM

## 2024-09-07 DIAGNOSIS — E782 Mixed hyperlipidemia: Secondary | ICD-10-CM

## 2024-09-08 ENCOUNTER — Ambulatory Visit: Admitting: Cardiovascular Disease

## 2024-09-08 ENCOUNTER — Encounter: Payer: Self-pay | Admitting: Cardiovascular Disease

## 2024-09-08 VITALS — BP 128/84 | HR 54 | Ht 71.0 in | Wt 172.0 lb

## 2024-09-08 DIAGNOSIS — R002 Palpitations: Secondary | ICD-10-CM

## 2024-09-08 DIAGNOSIS — I340 Nonrheumatic mitral (valve) insufficiency: Secondary | ICD-10-CM | POA: Diagnosis not present

## 2024-09-08 DIAGNOSIS — E782 Mixed hyperlipidemia: Secondary | ICD-10-CM | POA: Diagnosis not present

## 2024-09-08 DIAGNOSIS — R42 Dizziness and giddiness: Secondary | ICD-10-CM

## 2024-09-08 DIAGNOSIS — Z8679 Personal history of other diseases of the circulatory system: Secondary | ICD-10-CM | POA: Diagnosis not present

## 2024-09-08 DIAGNOSIS — I10 Essential (primary) hypertension: Secondary | ICD-10-CM

## 2024-09-08 DIAGNOSIS — G4733 Obstructive sleep apnea (adult) (pediatric): Secondary | ICD-10-CM

## 2024-09-08 MED ORDER — AMIODARONE HCL 100 MG PO TABS: 100.0000 mg | ORAL_TABLET | Freq: Every day | ORAL | 11 refills | Status: AC

## 2024-09-08 NOTE — Progress Notes (Addendum)
 "     Cardiology Office Note   Date:  09/08/2024   ID:  Maximiliano, Cromartie [birth year], MRN [id number]  PCP:  Diedra Lame, MD  Cardiologist:  Denyse Bathe, MD      History of Present Illness: Jason Winters is a 73 y.o. male who presents for  Chief Complaint  Patient presents with   Follow-up    Echo & u/s results    Looking up stairs and gets dizzy.      Past Medical History:  Diagnosis Date   AF (atrial fibrillation) (HCC)    Depression    Dysrhythmia    Hyperlipidemia    Hypertension    RLS (restless legs syndrome)    Vertigo      Past Surgical History:  Procedure Laterality Date   COLONOSCOPY N/A 10/17/2020   Procedure: COLONOSCOPY;  Surgeon: Toledo, Ladell POUR, MD;  Location: [facility];  Service: Gastroenterology;  Laterality: N/A;   HERNIA REPAIR       Current Outpatient Medications  Medication Sig Dispense Refill   acetaminophen (TYLENOL) 500 MG tablet Take 500 mg by mouth every 6 (six) hours as needed for moderate pain.     amiodarone  (PACERONE ) 100 MG tablet Take 1 tablet (100 mg total) by mouth daily. 30 tablet 11   buPROPion (WELLBUTRIN XL) 300 MG 24 hr tablet Take 300 mg by mouth daily.     losartan -hydrochlorothiazide (HYZAAR) 100-25 MG tablet Take 1 tablet by mouth daily. 30 tablet 11   nortriptyline (PAMELOR) 25 MG capsule Take 25 mg by mouth daily. Take 1 capsule by mouth at bedtime     rivaroxaban  (XARELTO ) 20 MG TABS tablet Take 1 tablet (20 mg total) by mouth daily. 90 tablet 1   rosuvastatin (CRESTOR) 20 MG tablet TAKE 1 TABLET BY MOUTH EVERY DAY 90 tablet 1   No current facility-administered medications for this visit.    Allergies:   Patient has no known allergies.    Social History:   reports that he has never smoked. He has never used smokeless tobacco. He reports that he does not drink alcohol and does not use drugs.   Family History:  family history is not on file.    ROS:     Review of Systems  Constitutional:  Negative.   HENT: Negative.    Eyes: Negative.   Respiratory: Negative.    Gastrointestinal: Negative.   Genitourinary: Negative.   Musculoskeletal: Negative.   Skin: Negative.   Neurological: Negative.   Endo/Heme/Allergies: Negative.   Psychiatric/Behavioral: Negative.    All other systems reviewed and are negative.     All other systems are reviewed and negative.    PHYSICAL EXAM: VS:  BP 128/84   Pulse (!) 54   Ht 5' 11 (1.803 m)   Wt 172 lb (78 kg)   SpO2 99%   BMI 23.99 kg/m  , BMI Body mass index is 23.99 kg/m. Last weight:  Wt Readings from Last 3 Encounters:  09/08/24 172 lb (78 kg)  08/25/24 172 lb (78 kg)  06/22/24 171 lb 12.8 oz (77.9 kg)     Physical Exam Vitals reviewed.  Constitutional:      Appearance: Normal appearance. He is normal weight.  HENT:     Head: Normocephalic.     Nose: Nose normal.     Mouth/Throat:     Mouth: Mucous membranes are moist.  Eyes:     Pupils: Pupils are equal, round, and reactive to light.  Cardiovascular:  Rate and Rhythm: Normal rate and regular rhythm.     Pulses: Normal pulses.     Heart sounds: Normal heart sounds.  Pulmonary:     Effort: Pulmonary effort is normal.  Abdominal:     General: Abdomen is flat. Bowel sounds are normal.  Musculoskeletal:        General: Normal range of motion.     Cervical back: Normal range of motion.  Skin:    General: Skin is warm.  Neurological:     General: No focal deficit present.     Mental Status: He is alert.  Psychiatric:        Mood and Affect: Mood normal.       EKG:   Recent Labs: No results found for requested labs within last 365 days.    Lipid Panel No results found for: CHOL, TRIG, HDL, CHOLHDL, VLDL, LDLCALC, LDLDIRECT    Other studies Reviewed: Additional studies/ records that were reviewed today include:  Review of the above records demonstrates:       No data to display            ASSESSMENT AND PLAN:     ICD-10-CM   1. Dizziness  R42    HR 54, feels dizzy, get holter and change amidrone 200 alternating 100 mg    2. Mixed hyperlipidemia  E78.2 CARDIAC EVENT MONITOR    amiodarone  (PACERONE ) 100 MG tablet    3. Primary hypertension  I10 CARDIAC EVENT MONITOR    amiodarone  (PACERONE ) 100 MG tablet    4. Palpitation  R00.2 CARDIAC EVENT MONITOR    amiodarone  (PACERONE ) 100 MG tablet    5. Obstructive sleep apnea syndrome  G47.33 CARDIAC EVENT MONITOR    amiodarone  (PACERONE ) 100 MG tablet    6. Atrial fibrillation, currently in sinus rhythm  Z86.79 CARDIAC EVENT MONITOR    amiodarone  (PACERONE ) 100 MG tablet    7. Nonrheumatic mitral valve regurgitation  I34.0 CARDIAC EVENT MONITOR    amiodarone  (PACERONE ) 100 MG tablet       Problem List Items Addressed This Visit   None Visit Diagnoses       Dizziness    -  Primary   HR 54, feels dizzy, get holter and change amidrone 200 alternating 100 mg     Mixed hyperlipidemia       Relevant Medications   amiodarone  (PACERONE ) 100 MG tablet   Other Relevant Orders   CARDIAC EVENT MONITOR     Primary hypertension       Relevant Medications   amiodarone  (PACERONE ) 100 MG tablet   Other Relevant Orders   CARDIAC EVENT MONITOR     Palpitation       Relevant Medications   amiodarone  (PACERONE ) 100 MG tablet   Other Relevant Orders   CARDIAC EVENT MONITOR     Obstructive sleep apnea syndrome       Relevant Medications   amiodarone  (PACERONE ) 100 MG tablet   Other Relevant Orders   CARDIAC EVENT MONITOR     Atrial fibrillation, currently in sinus rhythm       Relevant Medications   amiodarone  (PACERONE ) 100 MG tablet   Other Relevant Orders   CARDIAC EVENT MONITOR     Nonrheumatic mitral valve regurgitation       Relevant Medications   amiodarone  (PACERONE ) 100 MG tablet   Other Relevant Orders   CARDIAC EVENT MONITOR          Disposition:   Return for holter and  f/u.    Total time spent: 35 minutes  Signed,  Denyse Bathe, MD  09/08/2024 9:26 AM    [facility] "

## 2024-09-11 ENCOUNTER — Telehealth: Payer: Self-pay | Admitting: Family Medicine

## 2024-09-11 NOTE — Telephone Encounter (Signed)
 Patient called stating [facility] was supposed to order a Holter monitor for him. Order hasn't been placed that I see and so it hasn't been approved and he is leaving for [place]  today and will not be back for a few weeks. Please place Holter monitor order if you wanted him to wear one.

## 2024-09-14 NOTE — Addendum Note (Signed)
 Addended by: FERNAND ALTER A on: 09/14/2024 10:53 AM   Modules accepted: Orders

## 2024-09-24 ENCOUNTER — Ambulatory Visit

## 2024-09-24 DIAGNOSIS — E782 Mixed hyperlipidemia: Secondary | ICD-10-CM

## 2024-09-24 DIAGNOSIS — G4733 Obstructive sleep apnea (adult) (pediatric): Secondary | ICD-10-CM

## 2024-09-24 DIAGNOSIS — I340 Nonrheumatic mitral (valve) insufficiency: Secondary | ICD-10-CM

## 2024-09-24 DIAGNOSIS — R002 Palpitations: Secondary | ICD-10-CM

## 2024-09-24 DIAGNOSIS — R42 Dizziness and giddiness: Secondary | ICD-10-CM

## 2024-09-24 DIAGNOSIS — Z8679 Personal history of other diseases of the circulatory system: Secondary | ICD-10-CM

## 2024-09-24 DIAGNOSIS — I10 Essential (primary) hypertension: Secondary | ICD-10-CM
